# Patient Record
Sex: Female | Born: 1955 | Race: White | Hispanic: No | Marital: Married | Smoking: Never smoker
Health system: Southern US, Community
[De-identification: ages and names within clinical notes are randomized; demographics above are authoritative.]

## PROBLEM LIST (undated history)

## (undated) DIAGNOSIS — I10 Essential (primary) hypertension: Secondary | ICD-10-CM

## (undated) DIAGNOSIS — T7840XA Allergy, unspecified, initial encounter: Secondary | ICD-10-CM

## (undated) HISTORY — PX: ABDOMINAL HYSTERECTOMY: SHX81

## (undated) HISTORY — PX: BREAST SURGERY: SHX581

---

## 2006-04-27 ENCOUNTER — Ambulatory Visit: Payer: Self-pay | Admitting: Gastroenterology

## 2006-04-30 ENCOUNTER — Ambulatory Visit: Payer: Self-pay | Admitting: Gastroenterology

## 2006-06-14 ENCOUNTER — Encounter: Payer: Self-pay | Admitting: Family Medicine

## 2006-07-02 ENCOUNTER — Encounter: Payer: Self-pay | Admitting: Family Medicine

## 2006-07-20 ENCOUNTER — Ambulatory Visit: Payer: Self-pay | Admitting: Family Medicine

## 2006-07-25 ENCOUNTER — Ambulatory Visit: Payer: Self-pay | Admitting: Family Medicine

## 2006-08-02 ENCOUNTER — Encounter: Payer: Self-pay | Admitting: Family Medicine

## 2006-10-17 ENCOUNTER — Ambulatory Visit: Payer: Self-pay | Admitting: Anesthesiology

## 2006-10-25 ENCOUNTER — Ambulatory Visit: Payer: Self-pay | Admitting: Anesthesiology

## 2006-11-22 ENCOUNTER — Ambulatory Visit: Payer: Self-pay | Admitting: Anesthesiology

## 2007-02-18 ENCOUNTER — Ambulatory Visit: Payer: Self-pay | Admitting: Anesthesiology

## 2007-04-16 ENCOUNTER — Ambulatory Visit: Payer: Self-pay | Admitting: Anesthesiology

## 2007-07-23 ENCOUNTER — Ambulatory Visit: Payer: Self-pay | Admitting: Anesthesiology

## 2008-03-10 ENCOUNTER — Emergency Department: Payer: Self-pay | Admitting: Emergency Medicine

## 2011-11-30 ENCOUNTER — Ambulatory Visit: Payer: Self-pay | Admitting: Family Medicine

## 2012-12-03 ENCOUNTER — Ambulatory Visit: Payer: Self-pay | Admitting: Family Medicine

## 2013-12-17 ENCOUNTER — Ambulatory Visit: Payer: Self-pay | Admitting: Family Medicine

## 2013-12-22 ENCOUNTER — Ambulatory Visit: Payer: Self-pay | Admitting: Family Medicine

## 2014-01-12 ENCOUNTER — Ambulatory Visit: Payer: Self-pay | Admitting: Family Medicine

## 2014-01-12 HISTORY — PX: BREAST BIOPSY: SHX20

## 2014-01-15 LAB — PATHOLOGY REPORT

## 2015-01-27 ENCOUNTER — Ambulatory Visit
Admit: 2015-01-27 | Disposition: A | Discharge status: Home / Self Care | Payer: Self-pay | Attending: Family Medicine | Admitting: Family Medicine

## 2016-10-23 ENCOUNTER — Encounter: Payer: Self-pay | Admitting: *Deleted

## 2016-10-24 ENCOUNTER — Ambulatory Visit: Payer: BLUE CROSS/BLUE SHIELD | Admitting: Anesthesiology

## 2016-10-24 ENCOUNTER — Ambulatory Visit
Admission: RE | Admit: 2016-10-24 | Discharge: 2016-10-24 | Disposition: A | Discharge status: Home / Self Care | Payer: BLUE CROSS/BLUE SHIELD | Source: Ambulatory Visit | Attending: Gastroenterology | Admitting: Gastroenterology

## 2016-10-24 ENCOUNTER — Encounter
Admission: RE | Disposition: A | Discharge status: Home / Self Care | Payer: Self-pay | Source: Ambulatory Visit | Attending: Gastroenterology

## 2016-10-24 DIAGNOSIS — D123 Benign neoplasm of transverse colon: Secondary | ICD-10-CM | POA: Diagnosis not present

## 2016-10-24 DIAGNOSIS — I10 Essential (primary) hypertension: Secondary | ICD-10-CM | POA: Diagnosis not present

## 2016-10-24 DIAGNOSIS — Z8371 Family history of colonic polyps: Secondary | ICD-10-CM | POA: Insufficient documentation

## 2016-10-24 DIAGNOSIS — Z1211 Encounter for screening for malignant neoplasm of colon: Secondary | ICD-10-CM | POA: Diagnosis present

## 2016-10-24 DIAGNOSIS — K5730 Diverticulosis of large intestine without perforation or abscess without bleeding: Secondary | ICD-10-CM | POA: Insufficient documentation

## 2016-10-24 DIAGNOSIS — Z7982 Long term (current) use of aspirin: Secondary | ICD-10-CM | POA: Diagnosis not present

## 2016-10-24 DIAGNOSIS — Z79899 Other long term (current) drug therapy: Secondary | ICD-10-CM | POA: Diagnosis not present

## 2016-10-24 HISTORY — DX: Allergy, unspecified, initial encounter: T78.40XA

## 2016-10-24 HISTORY — PX: COLONOSCOPY: SHX5424

## 2016-10-24 HISTORY — DX: Essential (primary) hypertension: I10

## 2016-10-24 SURGERY — COLONOSCOPY
Anesthesia: General

## 2016-10-24 MED ORDER — MIDAZOLAM HCL 2 MG/2ML IJ SOLN
INTRAMUSCULAR | Status: AC
  Filled 2016-10-24: qty 2

## 2016-10-24 MED ORDER — EPHEDRINE SULFATE 50 MG/ML IJ SOLN
INTRAMUSCULAR | Status: DC | PRN
  Administered 2016-10-24 (×2): 15 mg via INTRAVENOUS

## 2016-10-24 MED ORDER — FENTANYL CITRATE (PF) 100 MCG/2ML IJ SOLN
INTRAMUSCULAR | Status: AC
  Filled 2016-10-24: qty 2

## 2016-10-24 MED ORDER — LIDOCAINE HCL (CARDIAC) 20 MG/ML IV SOLN
INTRAVENOUS | Status: DC | PRN
  Administered 2016-10-24: 40 mg via INTRAVENOUS

## 2016-10-24 MED ORDER — SODIUM CHLORIDE 0.9 % IV SOLN
INTRAVENOUS | Status: DC
  Administered 2016-10-24 (×2): via INTRAVENOUS

## 2016-10-24 MED ORDER — MIDAZOLAM HCL 2 MG/2ML IJ SOLN
INTRAMUSCULAR | Status: DC | PRN
  Administered 2016-10-24: 1 mg via INTRAVENOUS

## 2016-10-24 MED ORDER — PROPOFOL 500 MG/50ML IV EMUL
INTRAVENOUS | Status: AC
  Filled 2016-10-24: qty 50

## 2016-10-24 MED ORDER — PROPOFOL 10 MG/ML IV BOLUS
INTRAVENOUS | Status: DC | PRN
  Administered 2016-10-24: 40 mg via INTRAVENOUS
  Administered 2016-10-24: 10 mg via INTRAVENOUS

## 2016-10-24 MED ORDER — EPHEDRINE 5 MG/ML INJ
INTRAVENOUS | Status: AC
  Filled 2016-10-24: qty 10

## 2016-10-24 MED ORDER — FENTANYL CITRATE (PF) 100 MCG/2ML IJ SOLN
INTRAMUSCULAR | Status: DC | PRN
  Administered 2016-10-24: 50 ug via INTRAVENOUS

## 2016-10-24 MED ORDER — SODIUM CHLORIDE 0.9 % IV SOLN: INTRAVENOUS | Status: DC

## 2016-10-24 MED ORDER — PROPOFOL 500 MG/50ML IV EMUL
INTRAVENOUS | Status: DC | PRN
Start: 2016-10-24 — End: 2016-10-24
  Administered 2016-10-24: 150 ug/kg/min via INTRAVENOUS

## 2016-10-24 NOTE — Op Note (Signed)
[facility] Gastroenterology Patient Name: Stacy Strong Procedure Date: 10/24/2016 12:59 PM MRN: [id number] Account #: [id number] Date of Birth: 1956/03/19 Admit Type: Outpatient Age: 61 Room: [facility] ENDO ROOM 3 Gender: Female Note Status: Finalized Procedure:            Colonoscopy Indications:          Colon cancer screening in patient at increased risk:                        Family history of 1st-degree relative with colon polyps Providers:            Lollie Sails, MD Referring MD:         Gayland Curry MD, MD (Referring MD) Medicines:            Monitored Anesthesia Care Complications:        No immediate complications. Procedure:            Pre-Anesthesia Assessment:                       - ASA Grade Assessment: II - A patient with mild                        systemic disease.                       After obtaining informed consent, the colonoscope was                        passed under direct vision. Throughout the procedure,                        the patient's blood pressure, pulse, and oxygen                        saturations were monitored continuously. The                        Colonoscope was introduced through the anus and                        advanced to the the cecum, identified by appendiceal                        orifice and ileocecal valve. The colonoscopy was                        performed without difficulty. The patient tolerated the                        procedure well. The quality of the bowel preparation                        was good. Findings:      A 3 mm polyp was found in the hepatic flexure. The polyp was sessile.       The polyp was removed with a cold snare. Resection and retrieval were       complete.      A few small-mouthed diverticula were found in the sigmoid colon.      The digital rectal exam was normal. Impression:           -  One 3 mm polyp at the hepatic flexure, removed with a   cold snare. Resected and retrieved.                       - Diverticulosis in the sigmoid colon. Recommendation:       - Discharge patient to home.                       - Await pathology results.                       - Telephone GI clinic for pathology results in 1 week. Procedure Code(s):    --- Professional ---                       [phone], Colonoscopy, flexible; with removal of tumor(s),                        polyp(s), or other lesion(s) by snare technique Diagnosis Code(s):    --- Professional ---                       Z83.71, Family history of colonic polyps                       D12.3, Benign neoplasm of transverse colon (hepatic                        flexure or splenic flexure)                       K57.30, Diverticulosis of large intestine without                        perforation or abscess without bleeding CPT copyright 2016 [employer]. All rights reserved. The codes documented in this report are preliminary and upon coder review may  be revised to meet current compliance requirements. Lollie Sails, MD 10/24/2016 1:29:10 PM This report has been signed electronically. Number of Addenda: 0 Note Initiated On: 10/24/2016 12:59 PM Scope Withdrawal Time: 0 hours 7 minutes 17 seconds  Total Procedure Duration: 0 hours 15 minutes 1 second       [facility]

## 2016-10-24 NOTE — Anesthesia Post-op Follow-up Note (Cosign Needed)
Anesthesia QCDR form completed.        

## 2016-10-24 NOTE — Anesthesia Procedure Notes (Signed)
Date/Time: 10/24/2016 1:10 PM Performed by: Allean Found Pre-anesthesia Checklist: Patient identified, Emergency Drugs available, Suction available, Patient being monitored and Timeout performed Patient Re-evaluated:Patient Re-evaluated prior to inductionOxygen Delivery Method: Nasal cannula Intubation Type: IV induction

## 2016-10-24 NOTE — Anesthesia Preprocedure Evaluation (Signed)
Anesthesia Evaluation  Patient identified by MRN, date of birth, ID band Patient awake    Reviewed: Allergy & Precautions, H&P , NPO status , Patient's Chart, lab work & pertinent test results, reviewed documented beta blocker date and time   History of Anesthesia Complications Negative for: history of anesthetic complications  Airway Mallampati: II  TM Distance: >3 FB Neck ROM: full    Dental  (+) Missing, Poor Dentition   Pulmonary Recent URI , Resolved,           Cardiovascular Exercise Tolerance: Good hypertension, (-) angina(-) CAD, (-) Past MI, (-) Cardiac Stents and (-) CABG (-) dysrhythmias (-) Valvular Problems/Murmurs     Neuro/Psych negative neurological ROS  negative psych ROS   GI/Hepatic negative GI ROS, Neg liver ROS,   Endo/Other  negative endocrine ROS  Renal/GU negative Renal ROS  negative genitourinary   Musculoskeletal   Abdominal   Peds  Hematology negative hematology ROS (+)   Anesthesia Other Findings Past Medical History: No date: Allergic state No date: Hypertension   Reproductive/Obstetrics negative OB ROS                             Anesthesia Physical Anesthesia Plan  ASA: II  Anesthesia Plan: General   Post-op Pain Management:    Induction:   Airway Management Planned:   Additional Equipment:   Intra-op Plan:   Post-operative Plan:   Informed Consent: I have reviewed the patients History and Physical, chart, labs and discussed the procedure including the risks, benefits and alternatives for the proposed anesthesia with the patient or authorized representative who has indicated his/her understanding and acceptance.   Dental Advisory Given  Plan Discussed with: Anesthesiologist, CRNA and Surgeon  Anesthesia Plan Comments:         Anesthesia Quick Evaluation

## 2016-10-24 NOTE — H&P (Signed)
Outpatient short stay form Pre-procedure 10/24/2016 12:59 PM Stacy Sails MD  Primary Physician: Dr. Gayland Curry  Reason for visit:  Screening colonoscopy  History of present illness:  Patient is a 61 year old female presenting today as above. She tolerated her prep well. She takes no 1 thinners. She does take a 81 mg aspirin daily that has been held for several days. She also relates that he she has some occasional left flank discomfort. He sees no blood in the stool.    Current Facility-Administered Medications:  .  0.9 %  sodium chloride infusion, , Intravenous, Continuous, Stacy Sails, MD, Last Rate: 20 mL/hr at 10/24/16 1153 .  0.9 %  sodium chloride infusion, , Intravenous, Continuous, Stacy Sails, MD  Prescriptions Prior to Admission  Medication Sig Dispense Refill Last Dose  . amitriptyline (ELAVIL) 50 MG tablet Take 50 mg by mouth at bedtime.   10/23/2016 at Unknown time  . aspirin EC 81 MG tablet Take 81 mg by mouth daily.   10/23/2016 at Unknown time  . conjugated estrogens (PREMARIN) vaginal cream Place 1 Applicatorful vaginally 2 (two) times a week.   Past Month at Unknown time  . docusate sodium (COLACE) 250 MG capsule Take 250 mg by mouth daily.   Past Week at Unknown time  . enalapril (VASOTEC) 5 MG tablet Take 5 mg by mouth daily.   10/24/2016 at Unknown time  . fluticasone (FLONASE) 50 MCG/ACT nasal spray Place 1 spray into both nostrils daily.   10/23/2016 at Unknown time  . montelukast (SINGULAIR) 10 MG tablet Take 10 mg by mouth at bedtime.   10/24/2016 at Unknown time  . mupirocin cream (BACTROBAN) 2 % Apply 1 application topically 2 (two) times daily.   Past Week at Unknown time  . triamterene-hydrochlorothiazide (DYAZIDE) 37.5-25 MG capsule Take 1 capsule by mouth daily.   10/24/2016 at Unknown time     No Known Allergies   Past Medical History:  Diagnosis Date  . Allergic state   . Hypertension     Review of systems:      Physical  Exam    Heart and lungs: Regular rate and rhythm without rub or gallop, lungs are bilaterally clear.    HEENT: Normocephalic atraumatic eyes are anicteric    Other:     Pertinant exam for procedure: Soft nontender nondistended bowel sounds positive normoactive.    Planned proceedures: Colonoscopy and indicated procedures. I have discussed the risks benefits and complications of procedures to include not limited to bleeding, infection, perforation and the risk of sedation and the patient wishes to proceed.    Stacy Sails, MD Gastroenterology 10/24/2016  12:59 PM

## 2016-10-24 NOTE — Transfer of Care (Signed)
Immediate Anesthesia Transfer of Care Note  Patient: Stacy Strong  Procedure(s) Performed: Procedure(s): COLONOSCOPY (N/A)  Patient Location: PACU  Anesthesia Type:General  Level of Consciousness: awake  Airway & Oxygen Therapy: Patient Spontanous Breathing and Patient connected to nasal cannula oxygen  Post-op Assessment: Report given to RN and Post -op Vital signs reviewed and stable  Post vital signs: Reviewed and stable  Last Vitals:  Vitals:   10/24/16 1140  BP: (!) 107/57  Pulse: 66  Resp: 18  Temp: (!) 35.9 C    Last Pain:  Vitals:   10/24/16 1140  TempSrc: Tympanic         Complications: No apparent anesthesia complications

## 2016-10-25 LAB — SURGICAL PATHOLOGY

## 2016-10-25 NOTE — Anesthesia Postprocedure Evaluation (Signed)
Anesthesia Post Note  Patient: Stacy Strong  Procedure(s) Performed: Procedure(s) (LRB): COLONOSCOPY (N/A)  Patient location during evaluation: Endoscopy Anesthesia Type: General Level of consciousness: awake and alert Pain management: pain level controlled Vital Signs Assessment: post-procedure vital signs reviewed and stable Respiratory status: spontaneous breathing, nonlabored ventilation, respiratory function stable and patient connected to nasal cannula oxygen Cardiovascular status: blood pressure returned to baseline and stable Postop Assessment: no signs of nausea or vomiting Anesthetic complications: no     Last Vitals:  Vitals:   10/24/16 1345 10/24/16 1405  BP: (!) 101/57 (!) 101/59  Pulse:    Resp:    Temp:      Last Pain:  Vitals:   10/25/16 0741  TempSrc:   PainSc: 0-No pain                 Martha Clan

## 2016-10-26 ENCOUNTER — Encounter: Payer: Self-pay | Admitting: Gastroenterology

## 2016-11-17 ENCOUNTER — Other Ambulatory Visit: Payer: Self-pay | Admitting: Family Medicine

## 2016-11-17 DIAGNOSIS — Z1231 Encounter for screening mammogram for malignant neoplasm of breast: Secondary | ICD-10-CM

## 2016-12-13 ENCOUNTER — Ambulatory Visit
Admission: RE | Admit: 2016-12-13 | Discharge: 2016-12-13 | Disposition: A | Discharge status: Home / Self Care | Payer: BLUE CROSS/BLUE SHIELD | Source: Ambulatory Visit | Attending: Family Medicine | Admitting: Family Medicine

## 2016-12-13 DIAGNOSIS — Z1231 Encounter for screening mammogram for malignant neoplasm of breast: Secondary | ICD-10-CM

## 2017-11-09 ENCOUNTER — Other Ambulatory Visit: Payer: Self-pay | Admitting: Family Medicine

## 2017-11-09 DIAGNOSIS — Z1231 Encounter for screening mammogram for malignant neoplasm of breast: Secondary | ICD-10-CM

## 2017-12-19 ENCOUNTER — Ambulatory Visit: Payer: BLUE CROSS/BLUE SHIELD | Attending: Family Medicine

## 2018-01-02 ENCOUNTER — Ambulatory Visit
Admission: RE | Admit: 2018-01-02 | Discharge: 2018-01-02 | Disposition: A | Discharge status: Home / Self Care | Payer: BLUE CROSS/BLUE SHIELD | Source: Ambulatory Visit | Attending: Family Medicine | Admitting: Family Medicine

## 2018-01-02 DIAGNOSIS — Z1231 Encounter for screening mammogram for malignant neoplasm of breast: Secondary | ICD-10-CM | POA: Diagnosis present

## 2019-03-03 ENCOUNTER — Other Ambulatory Visit: Payer: Self-pay | Admitting: Family Medicine

## 2019-03-03 DIAGNOSIS — Z1231 Encounter for screening mammogram for malignant neoplasm of breast: Secondary | ICD-10-CM

## 2019-04-16 ENCOUNTER — Other Ambulatory Visit: Payer: Self-pay

## 2019-04-16 ENCOUNTER — Ambulatory Visit
Admission: RE | Admit: 2019-04-16 | Discharge: 2019-04-16 | Disposition: A | Discharge status: Home / Self Care | Payer: BC Managed Care – PPO | Source: Ambulatory Visit | Attending: Family Medicine | Admitting: Family Medicine

## 2019-04-16 DIAGNOSIS — Z1231 Encounter for screening mammogram for malignant neoplasm of breast: Secondary | ICD-10-CM | POA: Diagnosis present

## 2020-03-02 ENCOUNTER — Other Ambulatory Visit: Payer: Self-pay | Admitting: Family Medicine

## 2020-03-02 DIAGNOSIS — Z1231 Encounter for screening mammogram for malignant neoplasm of breast: Secondary | ICD-10-CM

## 2020-04-16 ENCOUNTER — Ambulatory Visit
Admission: RE | Admit: 2020-04-16 | Discharge: 2020-04-16 | Disposition: A | Discharge status: Home / Self Care | Payer: BC Managed Care – PPO | Source: Ambulatory Visit | Attending: Family Medicine | Admitting: Family Medicine

## 2020-04-16 DIAGNOSIS — Z1231 Encounter for screening mammogram for malignant neoplasm of breast: Secondary | ICD-10-CM | POA: Insufficient documentation

## 2020-11-30 ENCOUNTER — Inpatient Hospital Stay
Admission: EM | Admit: 2020-11-30 | Discharge: 2020-12-03 | DRG: 151 | Disposition: A | Discharge status: Home / Self Care | Payer: BC Managed Care – PPO | Attending: Internal Medicine | Admitting: Internal Medicine

## 2020-11-30 ENCOUNTER — Other Ambulatory Visit: Payer: Self-pay

## 2020-11-30 DIAGNOSIS — J302 Other seasonal allergic rhinitis: Secondary | ICD-10-CM | POA: Diagnosis present

## 2020-11-30 DIAGNOSIS — J3489 Other specified disorders of nose and nasal sinuses: Secondary | ICD-10-CM

## 2020-11-30 DIAGNOSIS — K219 Gastro-esophageal reflux disease without esophagitis: Secondary | ICD-10-CM | POA: Diagnosis present

## 2020-11-30 DIAGNOSIS — I10 Essential (primary) hypertension: Secondary | ICD-10-CM | POA: Diagnosis not present

## 2020-11-30 DIAGNOSIS — Z79899 Other long term (current) drug therapy: Secondary | ICD-10-CM

## 2020-11-30 DIAGNOSIS — R040 Epistaxis: Secondary | ICD-10-CM | POA: Diagnosis not present

## 2020-11-30 DIAGNOSIS — G4733 Obstructive sleep apnea (adult) (pediatric): Secondary | ICD-10-CM | POA: Diagnosis present

## 2020-11-30 DIAGNOSIS — Z20822 Contact with and (suspected) exposure to covid-19: Secondary | ICD-10-CM | POA: Diagnosis present

## 2020-11-30 DIAGNOSIS — R1310 Dysphagia, unspecified: Secondary | ICD-10-CM | POA: Diagnosis present

## 2020-11-30 DIAGNOSIS — R519 Headache, unspecified: Secondary | ICD-10-CM | POA: Diagnosis present

## 2020-11-30 DIAGNOSIS — Z79818 Long term (current) use of other agents affecting estrogen receptors and estrogen levels: Secondary | ICD-10-CM

## 2020-11-30 DIAGNOSIS — Z6836 Body mass index (BMI) 36.0-36.9, adult: Secondary | ICD-10-CM

## 2020-11-30 LAB — BASIC METABOLIC PANEL
Anion gap: 8 (ref 5–15)
BUN: 18 mg/dL (ref 8–23)
CO2: 26 mmol/L (ref 22–32)
Calcium: 9 mg/dL (ref 8.9–10.3)
Chloride: 103 mmol/L (ref 98–111)
Creatinine, Ser: 0.87 mg/dL (ref 0.44–1.00)
GFR, Estimated: >60 mL/min (ref >60)
Glucose, Bld: 144 mg/dL — ABNORMAL HIGH (ref 70–99)
Potassium: 3.9 mmol/L (ref 3.5–5.1)
Sodium: 137 mmol/L (ref 135–145)

## 2020-11-30 LAB — CBC WITH DIFFERENTIAL/PLATELET
Abs Immature Granulocytes: 0.04 10*3/uL (ref 0.00–0.07)
Basophils Absolute: 0.1 10*3/uL (ref 0.0–0.1)
Basophils Relative: 1 %
Eosinophils Absolute: 0.1 10*3/uL (ref 0.0–0.5)
Eosinophils Relative: 1 %
HCT: 42.5 % (ref 36.0–46.0)
Hemoglobin: 14.3 g/dL (ref 12.0–15.0)
Immature Granulocytes: 0 %
Lymphocytes Relative: 17 %
Lymphs Abs: 2 10*3/uL (ref 0.7–4.0)
MCH: 29.1 pg (ref 26.0–34.0)
MCHC: 33.6 g/dL (ref 30.0–36.0)
MCV: 86.4 fL (ref 80.0–100.0)
Monocytes Absolute: 0.7 10*3/uL (ref 0.1–1.0)
Monocytes Relative: 6 %
Neutro Abs: 8.4 10*3/uL — ABNORMAL HIGH (ref 1.7–7.7)
Neutrophils Relative %: 75 %
Platelets: 273 10*3/uL (ref 150–400)
RBC: 4.92 MIL/uL (ref 3.87–5.11)
RDW: 13.1 % (ref 11.5–15.5)
WBC: 11.3 10*3/uL — ABNORMAL HIGH (ref 4.0–10.5)
nRBC: 0 % (ref 0.0–0.2)

## 2020-11-30 LAB — PROTIME-INR
INR: 1.1 (ref 0.8–1.2)
Prothrombin Time: 13.4 seconds (ref 11.4–15.2)

## 2020-11-30 MED ORDER — FENTANYL CITRATE (PF) 100 MCG/2ML IJ SOLN
50.0000 ug | INTRAMUSCULAR | Status: AC | PRN
  Administered 2020-12-01 (×4): 50 ug via INTRAVENOUS
  Filled 2020-11-30 (×4): qty 2

## 2020-11-30 MED ORDER — ONDANSETRON HCL 4 MG/2ML IJ SOLN: 4.0000 mg | Freq: Four times a day (QID) | INTRAMUSCULAR | Status: DC | PRN

## 2020-11-30 MED ORDER — METOPROLOL TARTRATE 5 MG/5ML IV SOLN: 5.0000 mg | INTRAVENOUS | Status: DC | PRN

## 2020-11-30 MED ORDER — ACETAMINOPHEN 325 MG PO TABS
325.0000 mg | ORAL_TABLET | Freq: Four times a day (QID) | ORAL | Status: DC | PRN
  Administered 2020-12-01 – 2020-12-03 (×4): 325 mg via ORAL
  Filled 2020-11-30 (×5): qty 1

## 2020-11-30 MED ORDER — ALBUTEROL SULFATE HFA 108 (90 BASE) MCG/ACT IN AERS
2.0000 | INHALATION_SPRAY | RESPIRATORY_TRACT | Status: DC | PRN
  Filled 2020-11-30: qty 6.7

## 2020-11-30 MED ORDER — MORPHINE SULFATE (PF) 2 MG/ML IV SOLN: 0.5000 mg | Freq: Once | INTRAVENOUS | Status: DC

## 2020-11-30 MED ORDER — FENTANYL CITRATE (PF) 100 MCG/2ML IJ SOLN
50.0000 ug | Freq: Once | INTRAMUSCULAR | Status: AC
  Administered 2020-11-30: 50 ug via INTRAVENOUS
  Filled 2020-11-30: qty 2

## 2020-11-30 MED ORDER — FENTANYL CITRATE (PF) 100 MCG/2ML IJ SOLN
INTRAMUSCULAR | Status: AC
  Administered 2020-11-30: 50 ug via INTRAVENOUS
  Filled 2020-11-30: qty 2

## 2020-11-30 MED ORDER — ONDANSETRON HCL 4 MG PO TABS
4.0000 mg | ORAL_TABLET | Freq: Four times a day (QID) | ORAL | Status: DC | PRN
Start: 2020-11-30 — End: 2020-12-03

## 2020-11-30 MED ORDER — FENTANYL CITRATE (PF) 100 MCG/2ML IJ SOLN: 50.0000 ug | Freq: Once | INTRAMUSCULAR | Status: AC

## 2020-11-30 MED ORDER — ENALAPRIL MALEATE 10 MG PO TABS: 5.0000 mg | ORAL_TABLET | Freq: Every day | ORAL | Status: DC

## 2020-11-30 MED ORDER — ONDANSETRON HCL 4 MG/2ML IJ SOLN
4.0000 mg | Freq: Once | INTRAMUSCULAR | Status: AC
  Administered 2020-11-30: 4 mg via INTRAVENOUS
  Filled 2020-11-30: qty 2

## 2020-11-30 MED ORDER — DM-GUAIFENESIN ER 30-600 MG PO TB12
1.0000 | ORAL_TABLET | Freq: Two times a day (BID) | ORAL | Status: DC | PRN
  Administered 2020-12-01: 1 via ORAL
  Filled 2020-11-30 (×2): qty 1

## 2020-11-30 MED ORDER — TRIAMTERENE-HCTZ 37.5-25 MG PO CAPS
1.0000 | ORAL_CAPSULE | Freq: Every day | ORAL | Status: DC
  Filled 2020-11-30 (×2): qty 1

## 2020-11-30 MED ORDER — HYDRALAZINE HCL 20 MG/ML IJ SOLN: 10.0000 mg | Freq: Four times a day (QID) | INTRAMUSCULAR | Status: DC | PRN

## 2020-11-30 MED ORDER — ACETAMINOPHEN 650 MG RE SUPP: 325.0000 mg | Freq: Four times a day (QID) | RECTAL | Status: DC | PRN

## 2020-11-30 MED ORDER — LABETALOL HCL 5 MG/ML IV SOLN
5.0000 mg | Freq: Once | INTRAVENOUS | Status: AC
  Administered 2020-11-30: 5 mg via INTRAVENOUS
  Filled 2020-11-30: qty 4

## 2020-11-30 MED ORDER — ONDANSETRON HCL 4 MG/2ML IJ SOLN
INTRAMUSCULAR | Status: AC
  Administered 2020-11-30: 4 mg via INTRAVENOUS
  Filled 2020-11-30: qty 2

## 2020-11-30 MED ORDER — ONDANSETRON HCL 4 MG/2ML IJ SOLN: 4.0000 mg | Freq: Once | INTRAMUSCULAR | Status: AC

## 2020-11-30 MED ORDER — FENTANYL CITRATE (PF) 100 MCG/2ML IJ SOLN
50.0000 ug | Freq: Once | INTRAMUSCULAR | Status: AC
  Administered 2020-11-30: 50 ug via INTRAVENOUS

## 2020-11-30 NOTE — ED Notes (Signed)
Pain meds given.   md at bedside.

## 2020-11-30 NOTE — H&P (Addendum)
History and Physical   Stacy Strong [id number] DOB: Feb 04, 1956 DOA: 11/30/2020  PCP: Gayland Curry, MD  Patient coming from: home via EMS  I have personally briefly reviewed patient's old medical records in [place].  Chief Concern: Epistasis  HPI: Stacy Strong is a 65 y.o. female with medical history significant for hypertension, presents to the ED for chief concerns of nosebleed.  She denies history of bleeding disorder.  She denies trauma.  She reports this is never happened before.  She developed bleeding at home however was able to control it minimally and went to golf.  She was playing golf and mated to the first hole when she developed bleeding of her nose.  She denies fever, chest pain, shortness of breath, dysuria, hematuria.  She endorses pain with swallowing at this time.  Past medical history of mucosal bleeding: No  Social history: Spouse, denies alcohol of smoking, EtOH, recreational drug use  Vaccination: She is vaccinated for COVID-19  ROS: Constitutional: no weight change, no fever ENT/Mouth: no sore throat, no rhinorrhea Eyes: no eye pain, no vision changes Cardiovascular: no chest pain, no dyspnea,  no edema, no palpitations Respiratory: no cough, no sputum, no wheezing Gastrointestinal: no nausea, no vomiting, no diarrhea, no constipation Genitourinary: no urinary incontinence, no dysuria, no hematuria Musculoskeletal: no arthralgias, no myalgias Skin: no skin lesions, no pruritus, Neuro: + weakness, no loss of consciousness, no syncope Psych: no anxiety, no depression, + decrease appetite Heme/Lymph: no bruising, no bleeding  ED Course: Discussed with ED provider, patient requiring hospitalization due to persistent epistasis requiring posterior double-balloon packing applied bilaterally by ED provider.  Vitals in the ED was afebrile with temperature of 98, heart heart rate 79, respiration rate 16, blood pressure 133/79, satting at 96  percent on room air.  PT was 13.4, INR 1.1, patient is not taking any anticoagulation.  She takes a daily low-dose aspirin.  WBC elevated at 11.3, hemoglobin 14.3, platelets 273.  Assessment/Plan  Active Problems:   Bleeding from the nose   Essential hypertension   Epistasis - etiology unclear -Status post bilateral posterior double balloon packing for tamponade -ENT has been consulted, Dr. Pryor Ochoa and he is aware of patient -If patient remains stable on telemetry and does not feel dizziness or presyncopal events, patient can likely be discharged tomorrow 12/01/2020 and follow-up with ENT outpatient for removal of the bilateral double-balloon nasal packing -Patient admitted for observation due to risk of syncope and persistent epistasis requiring bilateral posterior double-balloon tamponading -Pain control fentanyl 50 mcg every 2 hours as needed for severe moderate pain, 1 day -A.m. team to help arrange for ENT outpatient appointment -At the 10 mL port-there are 10 mL of air bilaterally for tamponade -Note: At the 30 ml port-there are 10 mL of air bilaterally, if needed an additional 20 ml of air can be added via syringe for further tamponade  Hypertension-on home hydrochlorothiazide 12.5 mg daily  Seasonal allergies Singulair nightly, Flonase daily,  GERD-famotidine 10 mg twice daily  Chart reviewed.   DVT prophylaxis: TED hose Code Status: Full code Diet: N.p.o. Family Communication: Updated spouse at bedside Disposition Plan: Pending clinical course Consults called: ENT Admission status: Observation with telemetry  Past Medical History:  Diagnosis Date  . Allergic state   . Hypertension    Past Surgical History:  Procedure Laterality Date  . ABDOMINAL HYSTERECTOMY    . BREAST BIOPSY Left 01/12/2014    FOCAL MICROCALCIFICATIONS IN FIBROCYSTIC CHANGES.   Marland Kitchen BREAST  SURGERY Left    breast bx.with chip placement  . COLONOSCOPY N/A 10/24/2016   Procedure: COLONOSCOPY;   Surgeon: Lollie Sails, MD;  Location: [facility] ENDOSCOPY;  Service: Endoscopy;  Laterality: N/A;   Social History:  reports that she has never smoked. She has never used smokeless tobacco. She reports that she does not drink alcohol and does not use drugs.  No Known Allergies Family History  Problem Relation Age of Onset  . Breast cancer Maternal Aunt    Family history: Family history reviewed and not pertinent  Prior to Admission medications   Medication Sig Start Date End Date Taking? Authorizing Provider  amitriptyline (ELAVIL) 50 MG tablet Take 50 mg by mouth at bedtime.    [provider]  aspirin EC 81 MG tablet Take 81 mg by mouth daily.    [provider]  conjugated estrogens (PREMARIN) vaginal cream Place 1 Applicatorful vaginally 2 (two) times a week.    [provider]  docusate sodium (COLACE) 250 MG capsule Take 250 mg by mouth daily.    [provider]  enalapril (VASOTEC) 5 MG tablet Take 5 mg by mouth daily.    [provider]  fluticasone (FLONASE) 50 MCG/ACT nasal spray Place 1 spray into both nostrils daily.    [provider]  montelukast (SINGULAIR) 10 MG tablet Take 10 mg by mouth at bedtime.    [provider]  mupirocin cream (BACTROBAN) 2 % Apply 1 application topically 2 (two) times daily.    [provider]  triamterene-hydrochlorothiazide (DYAZIDE) 37.5-25 MG capsule Take 1 capsule by mouth daily.    [provider]   Physical Exam: Vitals:   11/30/20 1730 11/30/20 1745 11/30/20 1800 11/30/20 1809  BP: 133/79 137/67 (!) 149/70   Pulse: 85 80 76   Resp:    20  Temp:      TempSrc:      SpO2: 100% 95% 95%   Weight:      Height:       Constitutional: appears age-appropriate, NAD, calm, comfortable Eyes: PERRL, lids and conjunctivae normal ENMT: Mucous membranes are moist. Bilateral double balloon tamponade and bilateral nares. Posterior pharynx-presence of blood, postnasal  from naris. Age-appropriate dentition. Hearing appropriate Neck: normal, supple, no masses, no thyromegaly Respiratory: clear to auscultation bilaterally, no wheezing, no crackles. Normal respiratory effort. No accessory muscle use.  Cardiovascular: Regular rate and rhythm, no murmurs / rubs / gallops. No extremity edema. 2+ pedal pulses. No carotid bruits.  Abdomen: Obese abdomen, no tenderness, no masses palpated, no hepatosplenomegaly. Bowel sounds positive.  Musculoskeletal: no clubbing / cyanosis. No joint deformity upper and lower extremities. Good ROM, no contractures, no atrophy. Normal muscle tone.  Skin: no rashes, lesions, ulcers. No induration Neurologic: Sensation intact. Strength 5/5 in all 4.  Psychiatric: Normal judgment and insight. Alert and oriented x 3. Normal mood.   EKG: independently reviewed, showing normal sinus rhythm, rate of 71, QTc 425  Chest x-ray on Admission: Not indicated  Labs on Admission: I have personally reviewed following labs  CBC: Recent Labs  Lab 11/30/20 1520  WBC 11.3*  NEUTROABS 8.4*  HGB 14.3  HCT 42.5  MCV 86.4  PLT 273   Coagulation Profile: Recent Labs  Lab 11/30/20 1520  INR 1.1   Arma Reining N Ger Nicks D.O. [facility]  If 7PM-7AM, please contact overnight-coverage provider If 7AM-7PM, please contact day coverage provider [email or web address]  11/30/2020, 6:12 PM

## 2020-11-30 NOTE — ED Notes (Signed)
Pt calmer now.  Minimal bleeding from nares.  Family with pt.

## 2020-11-30 NOTE — ED Notes (Signed)
Report messaged to debra rn floor nurse

## 2020-11-30 NOTE — ED Notes (Addendum)
RHINO REMOVED FROM BOTH NARES BY MD.  Replaced with nasal catheter by dr Tamala Julian  Pt crying

## 2020-11-30 NOTE — ED Provider Notes (Signed)
[facility] Emergency Department Provider Note  ____________________________________________   Event Date/Time   First MD Initiated Contact with Patient 11/30/20 1448     (approximate)  I have reviewed the triage vital signs and the nursing notes.   HISTORY  Chief Complaint Epistaxis   HPI Stacy Strong is a 65 y.o. female with a past medical history of OSA, on CPAP at night and HTN who [place] of nosebleed.  Patient states she has had on and off nosebleeds over the last 4 days and had one earlier this morning that improved but then when she went out golfing earlier today started bleeding again from both nostrils.  She states a little bit of headache and has been spitting up blood since her second nosebleed started.  She states she got some Afrin into both nostrils and has a clamp in place over the last 45 minutes.  She endorses a little bit of a headache but no chest pain, cough, shortness of breath, diarrhea, dysuria, Donnell pain, back pain, rash or extremity pain.  Denies any earache, sore throat, vision changes or any other acute sick symptoms.  No recent trauma.  She states all of these episodes began spontaneously.  She does take ASA daily.  She did take blood pressure medicine earlier today as well.  Denies any others with the use or known bleeding disorder.  No prior similar episodes.  No clear alleviating aggravating or precipitating events.         Past Medical History:  Diagnosis Date  . Allergic state   . Hypertension     Patient Active Problem List   Diagnosis Date Noted  . Bleeding from the nose 11/30/2020  . Essential hypertension 11/30/2020    Past Surgical History:  Procedure Laterality Date  . ABDOMINAL HYSTERECTOMY    . BREAST BIOPSY Left 01/12/2014    FOCAL MICROCALCIFICATIONS IN FIBROCYSTIC CHANGES.   Marland Kitchen BREAST SURGERY Left    breast bx.with chip placement  . COLONOSCOPY N/A 10/24/2016    Procedure: COLONOSCOPY;  Surgeon: Lollie Sails, MD;  Location: [facility] ENDOSCOPY;  Service: Endoscopy;  Laterality: N/A;    Prior to Admission medications   Medication Sig Start Date End Date Taking? Authorizing Provider  amitriptyline (ELAVIL) 50 MG tablet Take 50 mg by mouth at bedtime.    [provider]  aspirin EC 81 MG tablet Take 81 mg by mouth daily.    [provider]  conjugated estrogens (PREMARIN) vaginal cream Place 1 Applicatorful vaginally 2 (two) times a week.    [provider]  docusate sodium (COLACE) 250 MG capsule Take 250 mg by mouth daily.    [provider]  enalapril (VASOTEC) 5 MG tablet Take 5 mg by mouth daily.    [provider]  fluticasone (FLONASE) 50 MCG/ACT nasal spray Place 1 spray into both nostrils daily.    [provider]  montelukast (SINGULAIR) 10 MG tablet Take 10 mg by mouth at bedtime.    [provider]  mupirocin cream (BACTROBAN) 2 % Apply 1 application topically 2 (two) times daily.    [provider]  triamterene-hydrochlorothiazide (DYAZIDE) 37.5-25 MG capsule Take 1 capsule by mouth daily.    [provider]    Allergies Patient has no known allergies.  Family History  Problem Relation Age of Onset  . Breast cancer Maternal Aunt     Social History Social History   Tobacco Use  .  Smoking status: Never Smoker  . Smokeless tobacco: Never Used  Substance Use Topics  . Alcohol use: No  . Drug use: No    Review of Systems  Review of Systems  Constitutional: Negative for chills and fever.  HENT: Positive for nosebleeds. Negative for sore throat.   Eyes: Negative for pain.  Respiratory: Negative for cough and stridor.   Cardiovascular: Negative for chest pain.  Gastrointestinal: Negative for vomiting.  Skin: Negative for rash.  Neurological: Negative for seizures, loss of consciousness and headaches.  Psychiatric/Behavioral: Negative for  suicidal ideas.  All other systems reviewed and are negative.     ____________________________________________   PHYSICAL EXAM:  VITAL SIGNS: ED Triage Vitals  Enc Vitals Group     BP      Pulse      Resp      Temp      Temp src      SpO2      Weight      Height      Head Circumference      Peak Flow      Pain Score      Pain Loc      Pain Edu?      Excl. in [place]?    Vitals:   11/30/20 1815 11/30/20 1830  BP: (!) 152/108 (!) 157/67  Pulse: 68 80  Resp:    Temp:    SpO2: 94% 98%   Physical Exam Vitals and nursing note reviewed.  Constitutional:      General: She is not in acute distress.    Appearance: She is well-developed and well-nourished.  HENT:     Head: Normocephalic and atraumatic.     Right Ear: External ear normal.     Left Ear: External ear normal.  Eyes:     Conjunctiva/sclera: Conjunctivae normal.  Cardiovascular:     Rate and Rhythm: Normal rate and regular rhythm.     Heart sounds: No murmur heard.   Pulmonary:     Effort: Pulmonary effort is normal. No respiratory distress.     Breath sounds: Normal breath sounds.  Abdominal:     Palpations: Abdomen is soft.     Tenderness: There is no abdominal tenderness.  Musculoskeletal:        General: No edema.     Cervical back: Neck supple.  Skin:    General: Skin is warm and dry.  Neurological:     Mental Status: She is alert.  Psychiatric:        Mood and Affect: Mood and affect normal.      ____________________________________________   LABS (all labs ordered are listed, but only abnormal results are displayed)  Labs Reviewed  CBC WITH DIFFERENTIAL/PLATELET - Abnormal; Notable for the following components:      Result Value   WBC 11.3 (*)    Neutro Abs 8.4 (*)    All other components within normal limits  BASIC METABOLIC PANEL - Abnormal; Notable for the following components:   Glucose, Bld 144 (*)    All other components within normal limits  SARS CORONAVIRUS 2 (TAT 6-24 HRS)   PROTIME-INR  HIV ANTIBODY (ROUTINE TESTING W REFLEX)  BASIC METABOLIC PANEL  CBC   ____________________________________________  EKG  Sinus rhythm with a ventricular of 71, normal axis, unremarkable intervals and no clear evidence of acute ischemia or other significant underlying arrhythmia. ____________________________________________  RADIOLOGY  ED MD interpretation:   Official radiology report(s): No results found.  ____________________________________________  PROCEDURES  Procedure(s) performed (including Critical Care):  .Epistaxis Management  Date/Time: 11/30/2020 6:45 PM Performed by: Lucrezia Starch, MD Authorized by: Lucrezia Starch, MD   Consent:    Consent obtained:  Verbal   Consent given by:  Patient   Risks, benefits, and alternatives were discussed: yes     Risks discussed:  Bleeding, pain and nasal injury   Alternatives discussed:  No treatment Universal protocol:    Procedure explained and questions answered to patient or proxy's satisfaction: yes     Patient identity confirmed:  Verbally with patient Anesthesia:    Anesthesia method:  None Procedure details:    Treatment site:  R septum, R posterior, L posterior and L septum   Repair method: initially, failed b/l anterior nasal packing, bleeding controleld w/ bilateral posterior backing. Posterio ballons inflated with 10cc each. Anterior ballons inflated with 5cc initially each but then 5cc more given after about 1 hour.   Treatment complexity:  Extensive   Treatment episode: recurring   Post-procedure details:    Assessment:  Bleeding decreased   Procedure completion:  Tolerated with difficulty .1-3 Lead EKG Interpretation Performed by: Lucrezia Starch, MD Authorized by: Lucrezia Starch, MD     Interpretation: normal     ECG rate assessment: normal     Rhythm: sinus rhythm     Ectopy: none     Conduction: normal       ____________________________________________   INITIAL  IMPRESSION / ASSESSMENT AND PLAN / ED COURSE      Patient presents for assessment of epistaxis.  On arrival she is hypertensive with a BP of 150/78 with otherwise stable vital signs on room air.  On exam she has a clamp in place cranial nerves II through XII grossly intact.  Lungs clear bilaterally.  INR unremarkable.  CBC with mild leukocytosis but normal hemoglobin platelets.  BMP unremarkable.  Initially attempted to manage patient's epistaxis with nasal clamp after Afrin was applied prior to arrival.  However patient had some persistent spitting up of blood with this.  Clamp was removed and extensive suctioning performed bilaterally which did not reveal a clear source.  Bilateral anterior packing applied although after approximately 45 minutes patient continued to spit up blood.  Anterior packing was removed and posterior double-balloon packing applied bilaterally.  Posterior balloons inflated with 10 cc of air.  Anterior balloon initially inflated with 5 bilaterally as this is all patient to tolerate although they were subsequently inflated an additional 5 cc bilaterally to 10 after approximately 1 hour.  Patient given below noted analgesia and antiemetics.  Discussed patient with Dr. Pryor Ochoa who is aware patient.  Patient will be admitted to medicine service for monitoring overnight.    ____________________________________________   FINAL CLINICAL IMPRESSION(S) / ED DIAGNOSES  Final diagnoses:  Epistaxis    Medications  enalapril (VASOTEC) tablet 5 mg (has no administration in time range)  triamterene-hydrochlorothiazide (DYAZIDE) 37.5-25 MG per capsule 1 capsule (has no administration in time range)  acetaminophen (TYLENOL) tablet 325 mg (has no administration in time range)    Or  acetaminophen (TYLENOL) suppository 325 mg (has no administration in time range)  ondansetron (ZOFRAN) tablet 4 mg (has no administration in time range)    Or  ondansetron (ZOFRAN) injection 4 mg (has no  administration in time range)  metoprolol tartrate (LOPRESSOR) injection 5 mg (has no administration in time range)  ondansetron (ZOFRAN) injection 4 mg (4 mg Intravenous Given 11/30/20 1552)  labetalol (NORMODYNE) injection  5 mg (5 mg Intravenous Given 11/30/20 1552)  ondansetron (ZOFRAN) injection 4 mg (4 mg Intravenous Given 11/30/20 1732)  fentaNYL (SUBLIMAZE) injection 50 mcg (50 mcg Intravenous Given 11/30/20 1738)  fentaNYL (SUBLIMAZE) injection 50 mcg (50 mcg Intravenous Given 11/30/20 1838)     ED Discharge Orders    None       Note:  This document was prepared using Dragon voice recognition software and may include unintentional dictation errors.   Lucrezia Starch, MD 11/30/20 [phone]

## 2020-11-30 NOTE — ED Notes (Signed)
Both nares with rhino packing in place.  Pt anxious.  Family with pt.

## 2020-11-30 NOTE — ED Notes (Signed)
Bleeding from both nares again   md aware.

## 2020-11-30 NOTE — ED Triage Notes (Signed)
BIB [facility] from golfing where pt developed a nose bleed. Reports frequent nose bleeds over last 4 days--had one this AM before golfing. Pt reports headache that proceeded the nose bleed 3/10 that still continues. Hypertensive with EMS. Pt with hx of hypertension, no missed mediations or changes in mediations. Takes 81mg  ASA daily. 2 sprays [place] to each nostril by EMS PTA, clamp in place. Spitting up small amount of bright red blood, reporting blood clots.

## 2020-11-30 NOTE — ED Notes (Signed)
Dr cox in with pt and family

## 2020-11-30 NOTE — ED Notes (Signed)
Nose clamp in place  meds given.  Pt alert.  md in with pt and family.

## 2020-11-30 NOTE — ED Notes (Signed)
md in with pt again  

## 2020-12-01 ENCOUNTER — Encounter: Payer: Self-pay | Admitting: Internal Medicine

## 2020-12-01 DIAGNOSIS — R040 Epistaxis: Secondary | ICD-10-CM | POA: Diagnosis not present

## 2020-12-01 DIAGNOSIS — I10 Essential (primary) hypertension: Secondary | ICD-10-CM

## 2020-12-01 LAB — BASIC METABOLIC PANEL
Anion gap: 9 (ref 5–15)
BUN: 26 mg/dL — ABNORMAL HIGH (ref 8–23)
CO2: 28 mmol/L (ref 22–32)
Calcium: 9 mg/dL (ref 8.9–10.3)
Chloride: 102 mmol/L (ref 98–111)
Creatinine, Ser: 0.93 mg/dL (ref 0.44–1.00)
GFR, Estimated: >60 mL/min (ref >60)
Glucose, Bld: 152 mg/dL — ABNORMAL HIGH (ref 70–99)
Potassium: 3.5 mmol/L (ref 3.5–5.1)
Sodium: 139 mmol/L (ref 135–145)

## 2020-12-01 LAB — CBC
HCT: 44.3 % (ref 36.0–46.0)
Hemoglobin: 14.2 g/dL (ref 12.0–15.0)
MCH: 29.4 pg (ref 26.0–34.0)
MCHC: 32.1 g/dL (ref 30.0–36.0)
MCV: 91.7 fL (ref 80.0–100.0)
Platelets: 195 10*3/uL (ref 150–400)
RBC: 4.83 MIL/uL (ref 3.87–5.11)
RDW: 13.6 % (ref 11.5–15.5)
WBC: 12.7 10*3/uL — ABNORMAL HIGH (ref 4.0–10.5)
nRBC: 0 % (ref 0.0–0.2)

## 2020-12-01 LAB — HIV ANTIBODY (ROUTINE TESTING W REFLEX): HIV Screen 4th Generation wRfx: NONREACTIVE

## 2020-12-01 LAB — SARS CORONAVIRUS 2 (TAT 6-24 HRS): SARS Coronavirus 2: NEGATIVE

## 2020-12-01 MED ORDER — MORPHINE SULFATE (PF) 2 MG/ML IV SOLN
2.0000 mg | INTRAVENOUS | Status: DC | PRN
  Administered 2020-12-01 – 2020-12-02 (×3): 2 mg via INTRAVENOUS
  Filled 2020-12-01 (×4): qty 1

## 2020-12-01 MED ORDER — AMOXICILLIN-POT CLAVULANATE 875-125 MG PO TABS
1.0000 | ORAL_TABLET | Freq: Two times a day (BID) | ORAL | Status: DC
  Administered 2020-12-01 – 2020-12-03 (×3): 1 via ORAL
  Filled 2020-12-01 (×4): qty 1

## 2020-12-01 MED ORDER — POLYETHYLENE GLYCOL 3350 17 G PO PACK
17.0000 g | PACK | Freq: Every day | ORAL | Status: DC
  Administered 2020-12-02: 17 g via ORAL
  Filled 2020-12-01 (×3): qty 1

## 2020-12-01 NOTE — Evaluation (Signed)
Physical Therapy Evaluation Patient Details Name: Stacy Strong MRN: [id number] DOB: 1955-11-08 Today's Date: 12/01/2020   History of Present Illness  Stacy Strong is a 65 y.o. female with medical history significant for hypertension, presents to the ED for chief concerns of nosebleed.     Clinical Impression  Pt received in Semi-Fowler's position and agreeable to therapy. Husband was in room for treatment session.  Pt performed bed-level exercises with good technique and some notable fatigue setting in with muscle quivering noted during SLR and Abduction bilaterally.  Pt was able to perform bed mobility to arrive in standing position with modI utilizing handrails and verbal cuing from therapist in order to have good hand placement.  Pt then utilized a FWW to stand with CGA.  Pt was given morphine during bed-level exercises and pt noted some "woozy" feeling upon standing.  Pt continued to stand and wait for symptoms to resolve. Pt then ambulated with diminished gait speed approximately 85 feet and back to the room.  Pt requested to use the [facility], and was assisted with verbal cuing for hand placement and how to transfer with use of FWW.  Pt performed self-care with assistance from therapist to remain in upright position.  Pt then transerred back to bed with all needs met and bed alarm set, call bell within arms reach.  Pt will continue to benefit from skilled therapy during hospital stay to address strength deficits in order to return to PLOF and discharge to home with HHPT.       Follow Up Recommendations Home health PT;Supervision for mobility/OOB    Equipment Recommendations  Rolling walker with 5" wheels    Recommendations for Other Services       Precautions / Restrictions Precautions Precautions: Fall Restrictions Weight Bearing Restrictions: No      Mobility  Bed Mobility Overal bed mobility: Modified Independent                  Transfers Overall transfer level:  Needs assistance Equipment used: Rolling walker (2 wheeled) Transfers: Sit to/from Stand Sit to Stand: Min guard Stand pivot transfers: Min assist       General transfer comment: Pt very slow with transfers likely due to morphine medication given during therapy.  Ambulation/Gait Ambulation/Gait assistance: Min assist Gait Distance (Feet): 85 Feet Assistive device: Rolling walker (2 wheeled) Gait Pattern/deviations: Step-through pattern;Decreased step length - right;Decreased step length - left;Decreased stride length Gait velocity: significantly decreased.   General Gait Details: Pt ambulates extremely slow, almost freezing at times.  Pt notes her head to be hurting the most while performed ambulation.  Also endorses fatigue from decrease hemoglobin.  Stairs            Wheelchair Mobility    Modified Rankin (Stroke Patients Only)       Balance Overall balance assessment: Needs assistance Sitting-balance support: No upper extremity supported;Feet supported Sitting balance-Leahy Scale: Good     Standing balance support: Bilateral upper extremity supported Standing balance-Leahy Scale: Fair                               Pertinent Vitals/Pain Pain Assessment: 0-10 Pain Score: 8  Pain Location: headache Pain Descriptors / Indicators: Headache Pain Intervention(s): Limited activity within patient's tolerance;Monitored during session;Repositioned    Home Living Family/patient expects to be discharged to:: Private residence Living Arrangements: Spouse/significant other Available Help at Discharge: Family;Available 24 hours/day (husband and daughter Investment banker, corporate))  Type of Home: Mobile home Home Access: Stairs to enter Entrance Stairs-Rails: Can reach both (B rails for 4, none for 2) Entrance Stairs-Number of Steps: 4 to porch then 2 to enter Home Layout: One level        Prior Function Level of Independence: Independent         Comments: Drives, golfs      Hand Dominance   Dominant Hand: Right    Extremity/Trunk Assessment   Upper Extremity Assessment Upper Extremity Assessment: Defer to OT evaluation    Lower Extremity Assessment Lower Extremity Assessment: Generalized weakness       Communication   Communication: No difficulties  Cognition Arousal/Alertness: Awake/alert Behavior During Therapy: WFL for tasks assessed/performed Overall Cognitive Status: Within Functional Limits for tasks assessed                                 General Comments: Difficult to understand at times due to nasal device and sore throat.      General Comments General comments (skin integrity, edema, etc.): SEATED: BP 152/75, HR 83. STANDING: BP 138/68, HR 98    Exercises Total Joint Exercises Ankle Circles/Pumps: AROM;Strengthening;Both;10 reps;Supine Quad Sets: AROM;Strengthening;Both;10 reps;Supine Gluteal Sets: AROM;Strengthening;Both;10 reps;Supine Heel Slides: AROM;Strengthening;Both;10 reps;Supine Hip ABduction/ADduction: AROM;Strengthening;Both;10 reps;Supine Straight Leg Raises: AROM;Strengthening;Both;10 reps;Supine Marching in Standing: AROM;Strengthening;Both;10 reps;Supine Other Exercises Other Exercises: Pt and spouse educated re: OT role, DME recs, d/c recs, falls prevention, ECS, home/routines modifications Other Exercises: LBD, toileting, sup<>sit, sit<>stand, sitting/standing balance/tolerance, ~20 ft in room mobility Other Exercises: Pt and spouse educated on role of PT and service provided during hospital stay.  Pt also encouraged and educated on the importance to perform exercises while in the hospital. Other Exercises: STS, toileting, standing balance   Assessment/Plan    PT Assessment Patient needs continued PT services  PT Problem List Decreased strength;Decreased activity tolerance;Decreased balance;Decreased mobility       PT Treatment Interventions DME instruction;Gait training;Stair  training;Functional mobility training;Therapeutic activities;Therapeutic exercise;Balance training;Neuromuscular re-education    PT Goals (Current goals can be found in the Care Plan section)  Acute Rehab PT Goals Patient Stated Goal: To improve pain PT Goal Formulation: With patient Time For Goal Achievement: 12/15/20 Potential to Achieve Goals: Good    Frequency Min 2X/week   Barriers to discharge Inaccessible home environment Pt would have significant diffiulty navigating the stairs with current status.    Co-evaluation               AM-PAC PT "6 Clicks" Mobility  Outcome Measure Help needed turning from your back to your side while in a flat bed without using bedrails?: A Little Help needed moving from lying on your back to sitting on the side of a flat bed without using bedrails?: A Little Help needed moving to and from a bed to a chair (including a wheelchair)?: A Little Help needed standing up from a chair using your arms (e.g., wheelchair or bedside chair)?: A Little Help needed to walk in hospital room?: A Lot Help needed climbing 3-5 steps with a railing? : A Lot 6 Click Score: 16    End of Session Equipment Utilized During Treatment: Gait belt Activity Tolerance: Patient tolerated treatment well;Patient limited by fatigue Patient left: in bed;with call bell/phone within reach;with bed alarm set;with family/visitor present Nurse Communication: Mobility status PT Visit Diagnosis: Unsteadiness on feet (R26.81);Other abnormalities of gait and mobility (R26.89);Muscle weakness (generalized) (M62.81);Difficulty in walking, not  elsewhere classified (R26.2)    Time: 3094-0768 PT Time Calculation (min) (ACUTE ONLY): 55 min   Charges:   PT Evaluation $PT Eval Low Complexity: 1 Low PT Treatments $Gait Training: 23-37 mins $Therapeutic Exercise: 8-22 mins $Self Care/Home Management: 8-22        Gwenlyn Saran, PT, DPT 12/01/20, 5:15 PM

## 2020-12-01 NOTE — Plan of Care (Signed)
°  Problem: Clinical Measurements: °Goal: Ability to maintain clinical measurements within normal limits will improve °Outcome: Progressing °Goal: Will remain free from infection °Outcome: Progressing °Goal: Diagnostic test results will improve °Outcome: Progressing °Goal: Respiratory complications will improve °Outcome: Progressing °Goal: Cardiovascular complication will be avoided °Outcome: Progressing °  °Problem: Activity: °Goal: Risk for activity intolerance will decrease °Outcome: Progressing °  °Problem: Nutrition: °Goal: Adequate nutrition will be maintained °Outcome: Progressing °  °Problem: Coping: °Goal: Level of anxiety will decrease °Outcome: Progressing °  °Problem: Elimination: °Goal: Will not experience complications related to bowel motility °Outcome: Progressing °Goal: Will not experience complications related to urinary retention °Outcome: Progressing °  °Problem: Pain Managment: °Goal: General experience of comfort will improve °Outcome: Progressing °  °Problem: Safety: °Goal: Ability to remain free from injury will improve °Outcome: Progressing °  °

## 2020-12-01 NOTE — Evaluation (Signed)
Occupational Therapy Evaluation Patient Details Name: Stacy Strong MRN: [id number] DOB: 1956/07/03 Today's Date: 12/01/2020    History of Present Illness Stacy Strong is a 65 y.o. female with medical history significant for hypertension, presents to the ED for chief concerns of nosebleed.   Clinical Impression   Stacy Strong was seen for OT evaluation this date. Prior to hospital admission, pt was Independent for mobility and I/ADLs. Pt lives c husband in mobile home c 4 STE + B rails to porch then 2 STE no rails. Pt presents to acute OT demonstrating impaired ADL performance and functional mobility 2/2 decreased activity tolerance, functional strength/balance deficits, and pain. Pt endorses 8/10 headache t/o session, RN notified. Pt endorses dizziness with head turns, requires HHA + CGA for mobility - pt reports having RW at home if needed.   Pt currently requires CGA + HHA for toilet t/f, SUPERVISION perihygiene with lateral leans. MOD I don/doff B socks seated EOB - increased time 2/2 pain with head positional changes. Anticipate return to Independence as pain improves, husband at bedside and daughter (retired Therapist, sports) on phone confirm pt will have assistance at home. Pt would benefit from skilled OT to address noted impairments and functional limitations (see below for any additional details) in order to maximize safety and independence while minimizing falls risk and caregiver burden. Upon hospital discharge, recommend no OT follow up.  Orthostatics Seated: BP 152/75, HR 83 Standing: BP 138/68, HR 98    Follow Up Recommendations  No OT follow up;Supervision - Intermittent    Equipment Recommendations  3 in 1 bedside commode    Recommendations for Other Services       Precautions / Restrictions Precautions Precautions: Fall Restrictions Weight Bearing Restrictions: No      Mobility Bed Mobility Overal bed mobility: Modified Independent                  Transfers Overall  transfer level: Needs assistance Equipment used: 1 person hand held assist Transfers: Sit to/from Stand;Stand Pivot Transfers Sit to Stand: Min guard Stand pivot transfers: Min assist            Balance Overall balance assessment: Needs assistance Sitting-balance support: No upper extremity supported;Feet supported Sitting balance-Leahy Scale: Good     Standing balance support: Single extremity supported Standing balance-Leahy Scale: Fair                             ADL either performed or assessed with clinical judgement   ADL Overall ADL's : Needs assistance/impaired                                       General ADL Comments: CGA + HHA for toilet t/f, SUPERVISION perihygiene with lateral leans. MOD I don/doff B socks seated EOB - increased time 2/2 pain with head positional changes.                  Pertinent Vitals/Pain Pain Assessment: 0-10 Pain Score: 8  Pain Location: headache Pain Descriptors / Indicators: Headache Pain Intervention(s): Limited activity within patient's tolerance;Repositioned;Patient requesting pain meds-RN notified     Hand Dominance Right   Extremity/Trunk Assessment Upper Extremity Assessment Upper Extremity Assessment: Overall WFL for tasks assessed   Lower Extremity Assessment Lower Extremity Assessment: Generalized weakness       Communication Communication Communication: No difficulties  Cognition Arousal/Alertness: Awake/alert Behavior During Therapy: WFL for tasks assessed/performed Overall Cognitive Status: Within Functional Limits for tasks assessed                                     General Comments  SEATED: BP 152/75, HR 83. STANDING: BP 138/68, HR 98    Exercises Exercises: Other exercises Other Exercises Other Exercises: Pt and spouse educated re: OT role, DME recs, d/c recs, falls prevention, ECS, home/routines modifications Other Exercises: LBD, toileting,  sup<>sit, sit<>stand, sitting/standing balance/tolerance, ~20 ft in room mobility   Shoulder Instructions      Home Living Family/patient expects to be discharged to:: Private residence Living Arrangements: Spouse/significant other Available Help at Discharge: Family;Available 24 hours/day (husband and daughter Investment banker, corporate)) Type of Home: Mobile home Home Access: Stairs to enter [employer] of Steps: 4 to porch then 2 to enter Entrance Stairs-Rails: Can reach both (B rails for 4, none for 2) Home Layout: One level     Bathroom Shower/Tub: Walk-in shower                    Prior Functioning/Environment Level of Independence: Independent        Comments: Drives, golfs        OT Problem List: Decreased strength;Decreased activity tolerance;Impaired balance (sitting and/or standing)      OT Treatment/Interventions: Self-care/ADL training;Therapeutic exercise;DME and/or AE instruction;Energy conservation;Therapeutic activities;Patient/family education;Balance training    OT Goals(Current goals can be found in the care plan section) Acute Rehab OT Goals Patient Stated Goal: To improve pain OT Goal Formulation: With patient/family Time For Goal Achievement: 12/15/20 Potential to Achieve Goals: Good ADL Goals Pt Will Perform Grooming: Independently;standing Pt Will Transfer to Toilet: Independently;ambulating;regular height toilet Additional ADL Goal #1: Pt will Independently verbalize plan to implement x3 falls prevention strategies  OT Frequency: Min 1X/week    AM-PAC OT "6 Clicks" Daily Activity     Outcome Measure Help from another person eating meals?: None Help from another person taking care of personal grooming?: A Little Help from another person toileting, which includes using toliet, bedpan, or urinal?: A Little Help from another person bathing (including washing, rinsing, drying)?: A Little Help from another person to put on and taking off regular upper  body clothing?: None Help from another person to put on and taking off regular lower body clothing?: None 6 Click Score: 21   End of Session Nurse Communication: Patient requests pain meds  Activity Tolerance: Patient tolerated treatment well Patient left: in bed;with call bell/phone within reach;with bed alarm set;with family/visitor present  OT Visit Diagnosis: Other abnormalities of gait and mobility (R26.89)                Time: 7829-5621 OT Time Calculation (min): 37 min Charges:  OT General Charges $OT Visit: 1 Visit OT Evaluation $OT Eval Moderate Complexity: 1 Mod OT Treatments $Self Care/Home Management : 23-37 mins  Dessie Coma, M.S. OTR/L  12/01/20, 3:31 PM  ascom [phone]

## 2020-12-01 NOTE — Progress Notes (Signed)
Progress Note    JESELLE Strong  KGU:[id number] DOB: 09-Dec-1955  DOA: 11/30/2020 PCP: Stacy Curry, MD      Brief Narrative:    Medical records reviewed and are as summarized below:  Stacy Strong is a 65 y.o. female       Assessment/Plan:   Active Problems:   Bleeding from the nose   Essential hypertension    Body mass index is 36.32 kg/m.  (Morbid obesity)   Epistaxis: S/p bilateral nasal packing per ED physician on 11/30/2020.  Start Augmentin.  Continue analgesics for pain.  Patient requested to see ENT physician because she did not want to go home with nasal packing.  I notified Dr. Richardson Landry, ENT physician on-call, who said that it will not be advisable to remove nasal packing at this time.  He recommended the patient be discharged with nasal packing with outpatient follow-up in the office for removal.  Hypertension: Continue HCTZ.  Other comorbidities include OSA on CPAP, GERD and seasonal allergies  Diet Order            Diet NPO time specified  Diet effective now                    Consultants:  None  Procedures:  None    Medications:   . triamterene-hydrochlorothiazide  1 capsule Oral Daily   Continuous Infusions:   Anti-infectives (From admission, onward)   None             Family Communication/Anticipated D/C date and plan/Code Status   DVT prophylaxis: Place TED hose Start: 11/30/20 1828     Code Status: Full Code  Family Communication: Discussed with her husband and daughter Stacy Strong) at the bedside Disposition Plan:    Status is: Observation  The patient will require care spanning > 2 midnights and should be moved to inpatient because: Inpatient level of care appropriate due to severity of illness  Dispo: The patient is from: Home              Anticipated d/c is to: Home              Patient currently is not medically stable to d/c.   Difficult to place patient No           Subjective:    Interval events noted.  She complains of blood dripping from the right nose around the nasal packing.  Objective:    Vitals:   11/30/20 2040 12/01/20 0028 12/01/20 0551 12/01/20 0749  BP: (!) 171/66 (!) 141/62 (!) 122/50 (!) 148/57  Pulse: 62 76 62 74  Resp: (!) 22 20 16 19   Temp: 97.8 F (36.6 C) 98.4 F (36.9 C) 98.4 F (36.9 C) 98.6 F (37 C)  TempSrc: Oral Oral Oral   SpO2: 97% 99% 99% 96%  Weight:  93 kg    Height:  5\' 3"  (1.6 m)     No data found.   Intake/Output Summary (Last 24 hours) at 12/01/2020 1001 Last data filed at 12/01/2020 0551 Gross per 24 hour  Intake --  Output 0 ml  Net 0 ml   Filed Weights   11/30/20 1449 12/01/20 0028  Weight: 93 kg 93 kg    Exam:  GEN: NAD SKIN: Warm and dry.  No rash noted. EYES: EOMI ENT: MMM, bilateral nasal packing  CV: RRR PULM: CTA B ABD: soft, obese, NT, +BS CNS: AAO x 3, non focal EXT: No edema or tenderness  Data Reviewed:   I have personally reviewed following labs and imaging studies:  Labs: Labs show the following:   Basic Metabolic Panel: Recent Labs  Lab 11/30/20 1520 12/01/20 0520  NA 137 139  K 3.9 3.5  CL 103 102  CO2 26 28  GLUCOSE 144* 152*  BUN 18 26*  CREATININE 0.87 0.93  CALCIUM 9.0 9.0   GFR Estimated Creatinine Clearance: 67.1 mL/min (by C-G formula based on SCr of 0.93 mg/dL). Liver Function Tests: No results for input(s): AST, ALT, ALKPHOS, BILITOT, PROT, ALBUMIN in the last 168 hours. No results for input(s): LIPASE, AMYLASE in the last 168 hours. No results for input(s): AMMONIA in the last 168 hours. Coagulation profile Recent Labs  Lab 11/30/20 1520  INR 1.1    CBC: Recent Labs  Lab 11/30/20 1520 12/01/20 0520  WBC 11.3* 12.7*  NEUTROABS 8.4*  --   HGB 14.3 14.2  HCT 42.5 44.3  MCV 86.4 91.7  PLT 273 195   Cardiac Enzymes: No results for input(s): CKTOTAL, CKMB, CKMBINDEX, TROPONINI in the last 168 hours. BNP (last 3 results) No results for  input(s): PROBNP in the last 8760 hours. CBG: No results for input(s): GLUCAP in the last 168 hours. D-Dimer: No results for input(s): DDIMER in the last 72 hours. Hgb A1c: No results for input(s): HGBA1C in the last 72 hours. Lipid Profile: No results for input(s): CHOL, HDL, LDLCALC, TRIG, CHOLHDL, LDLDIRECT in the last 72 hours. Thyroid function studies: No results for input(s): TSH, T4TOTAL, T3FREE, THYROIDAB in the last 72 hours.  Invalid input(s): FREET3 Anemia work up: No results for input(s): VITAMINB12, FOLATE, FERRITIN, TIBC, IRON, RETICCTPCT in the last 72 hours. Sepsis Labs: Recent Labs  Lab 11/30/20 1520 12/01/20 0520  WBC 11.3* 12.7*    Microbiology Recent Results (from the past 240 hour(s))  SARS CORONAVIRUS 2 (TAT 6-24 HRS) Nasopharyngeal Nasopharyngeal Swab     Status: None   Collection Time: 11/30/20  6:34 PM   Specimen: Nasopharyngeal Swab  Result Value Ref Range Status   SARS Coronavirus 2 NEGATIVE NEGATIVE Final    Comment: (NOTE) SARS-CoV-2 target nucleic acids are NOT DETECTED.  The SARS-CoV-2 RNA is generally detectable in upper and lower respiratory specimens during the acute phase of infection. Negative results do not preclude SARS-CoV-2 infection, do not rule out co-infections with other pathogens, and should not be used as the sole basis for treatment or other patient management decisions. Negative results must be combined with clinical observations, patient history, and epidemiological information. The expected result is Negative.  Fact Sheet for Patients: SugarRoll.be  Fact Sheet for Healthcare Providers: https://www.woods-mathews.com/  This test is not yet approved or cleared by the [place] FDA and  has been authorized for detection and/or diagnosis of SARS-CoV-2 by FDA under an Emergency Use Authorization (EUA). This EUA will remain  in effect (meaning this test can be used) for the  duration of the COVID-19 declaration under Se ction 564(b)(1) of the Act, 21 U.S.C. section 360bbb-3(b)(1), unless the authorization is terminated or revoked sooner.  Performed at [facility], [place] [address], [place], [place] [address]     Procedures and diagnostic studies:  No results found.             LOS: 0 days   Onis Markoff  [facility]   Pager on [email or web address]. If 7PM-7AM, please contact night-coverage at [email or web address]     12/01/2020, 10:01 AM

## 2020-12-02 DIAGNOSIS — R040 Epistaxis: Secondary | ICD-10-CM | POA: Diagnosis not present

## 2020-12-02 DIAGNOSIS — I10 Essential (primary) hypertension: Secondary | ICD-10-CM | POA: Diagnosis not present

## 2020-12-02 MED ORDER — MORPHINE SULFATE (PF) 2 MG/ML IV SOLN
1.0000 mg | Freq: Once | INTRAVENOUS | Status: AC
  Administered 2020-12-02: 1 mg via INTRAVENOUS

## 2020-12-02 MED ORDER — DEXTROSE-NACL 5-0.9 % IV SOLN: INTRAVENOUS | Status: DC

## 2020-12-02 MED ORDER — TRIAMTERENE-HCTZ 37.5-25 MG PO TABS
1.0000 | ORAL_TABLET | Freq: Every day | ORAL | Status: DC
  Administered 2020-12-02: 1 via ORAL
  Filled 2020-12-02 (×2): qty 1

## 2020-12-02 NOTE — Progress Notes (Signed)
Clinical/Bedside Swallow Evaluation Patient Details  Name: MALEY VENEZIA MRN: [id number] Date of Birth: 11-05-1955  Today's Date: 12/02/2020 Time: SLP Start Time (ACUTE ONLY): 84 SLP Stop Time (ACUTE ONLY): 1545 SLP Time Calculation (min) (ACUTE ONLY): 35 min  Past Medical History:  Past Medical History:  Diagnosis Date   Allergic state    Hypertension    Past Surgical History:  Past Surgical History:  Procedure Laterality Date   ABDOMINAL HYSTERECTOMY     BREAST BIOPSY Left 01/12/2014    FOCAL MICROCALCIFICATIONS IN FIBROCYSTIC CHANGES.    BREAST SURGERY Left    breast bx.with chip placement   COLONOSCOPY N/A 10/24/2016   Procedure: COLONOSCOPY;  Surgeon: Lollie Sails, MD;  Location: [facility] ENDOSCOPY;  Service: Endoscopy;  Laterality: N/A;   HPI:  Pt is 65 y.o. female admitted after 1 week of intermittent nosebleeds, packed in ER x2. Reported dysphagia, odynophagia s/p bilateral pack placement. ENT was consulted and removed nasal balloon that had migrated with tip in oropharynx.   Assessment / Plan / Recommendation Clinical Impression   Patient reports dysphagia has improved since ENT procedure; she still has odynophagia, rates as 5/10 when swallowing. Patient oral motor examination reveals functional strength and range of motion. She denies history of swallowing problems prior to admission. She consumed thin liquids, puree, soft and regular solids without overt signs of aspiration, however she reports worsening odynophagia with dry solid. With texture modification (cereal softened in milk), patient tolerated solids without increased pain. Appears with minimal risk for aspiration when following general aspiration precautions. Recommend mechanical soft (dysphagia 3) diet with thin liquids, straws OK. Pt wishes to continue meds crushed in puree at this time, but RN may attempt whole with liquid if she requests this. Pt/spouse continue with some concerns due to the swallowing  difficulties she was having earlier today; SLP will follow up x1 for diet tolerance.     SLP Visit Diagnosis: Dysphagia, unspecified (R13.10)    Aspiration Risk  No limitations    Diet Recommendation Dysphagia 3 (Mech soft);Thin liquid   Liquid Administration via: Cup;Straw Medication Administration: Crushed with puree (per pt preference; can do whole with liquid if she requests) Supervision: Patient able to self feed Compensations: Slow rate;Small sips/bites Postural Changes: Seated upright at 90 degrees    Other  Recommendations Oral Care Recommendations: Oral care BID   Follow up Recommendations None      Frequency and Duration min 1 x/week  1 week       Prognosis Prognosis for Safe Diet Advancement: Good      Swallow Study   General Date of Onset: 11/30/20 HPI: Pt is 65 y.o. female admitted after 1 week of intermittent nosebleeds, packed in ER x2. Reported dysphagia, odynophagia s/p bilateral pack placement. ENT was consulted and removed nasal balloon that had migrated with tip in oropharynx. Type of Study: Bedside Swallow Evaluation Previous Swallow Assessment: none on file Diet Prior to this Study: Thin liquids (clear liquids) Temperature Spikes Noted: No Respiratory Status: Room air History of Recent Intubation: No Behavior/Cognition: Alert;Cooperative Oral Cavity Assessment: Within Functional Limits (has been expectorating blood after epistaxis) Oral Care Completed by SLP: Recent completion by staff Oral Cavity - Dentition: Adequate natural dentition Vision: Functional for self-feeding Self-Feeding Abilities: Able to feed self Patient Positioning: Upright in bed Baseline Vocal Quality: Normal Volitional Cough: Strong Volitional Swallow: Able to elicit    Oral/Motor/Sensory Function Overall Oral Motor/Sensory Function: Within functional limits   Ice Chips Ice chips: Not tested  Thin Liquid Thin Liquid: Within functional limits Presentation: Straw Other  Comments: pain 5/10    Nectar Thick Nectar Thick Liquid: Not tested   Honey Thick Honey Thick Liquid: Not tested   Puree Puree: Within functional limits Presentation: Self Fed;Spoon Other Comments: pain 5/10   Solid     Solid: Within functional limits Presentation: Self Fed Other Comments: grimmaces, increased pain per pt     Deneise Lever, MS, Omar  Aliene Altes 12/02/2020,3:54 PM

## 2020-12-02 NOTE — Consult Note (Signed)
July, [name] [id number] Jan 30, 1956 Stacy Boroughs, MD  Reason for Consult: epistaxis  HPI: 65 y.o. female admitted after 1 week of intermittent nose bleeds on 11/30/2020.  Patient was packed in ER twice with second time being anterior/posterior balloon packs.  Patient reports difficulty swallowing and weakness and pain since bilateral pack placement.  She wishes to have packs removed if possible.  Reports facial pain, dysphagia, odynophagia, weakness.  Allergies: No Known Allergies  ROS: Review of systems normal other than 12 systems except per HPI.  PMH:  Past Medical History:  Diagnosis Date  . Allergic state   . Hypertension     FH:  Family History  Problem Relation Age of Onset  . Breast cancer Maternal Aunt     SH:  Social History   Socioeconomic History  . Marital status: Married    Spouse name: Not on file  . Number of children: Not on file  . Years of education: Not on file  . Highest education level: Not on file  Occupational History  . Not on file  Tobacco Use  . Smoking status: Never Smoker  . Smokeless tobacco: Never Used  Substance and Sexual Activity  . Alcohol use: No  . Drug use: No  . Sexual activity: Not on file  Other Topics Concern  . Not on file  Social History Narrative  . Not on file   Social Determinants of Health   Financial Resource Strain: Not on file  Food Insecurity: Not on file  Transportation Needs: Not on file  Physical Activity: Not on file  Stress: Not on file  Social Connections: Not on file  Intimate Partner Violence: Not on file    PSH:  Past Surgical History:  Procedure Laterality Date  . ABDOMINAL HYSTERECTOMY    . BREAST BIOPSY Left 01/12/2014    FOCAL MICROCALCIFICATIONS IN FIBROCYSTIC CHANGES.   Marland Kitchen BREAST SURGERY Left    breast bx.with chip placement  . COLONOSCOPY N/A 10/24/2016   Procedure: COLONOSCOPY;  Surgeon: Lollie Sails, MD;  Location: [facility] ENDOSCOPY;  Service: Endoscopy;  Laterality: N/A;     Physical  Exam:  GEN-  Mild distress from pain of nasal packing NEURO- CN 2-12 grossly intact and symmetric. EARS-  External ears clear NOSE-  Bilateral AP packs in place.  Right with posterior migration and end of tip visible in patient's ororpharynx.  Left side in place but with pressure on patient's nasal sill causing discoloration of skin.  No active bleeding OC/OP- old blood in posterior aspect but no active bleeding. NECK- No LAD RESP- unlabored CARD-  RRR  Procedure:  Nasal Endoscopy with control of nasal hemorrhage:  Pre-procedure diagnosis epistaxis.  Post-procedure diagnosis same.  Descriptions of procedure:  After verbal consent obtained the patient's right packing removed as it had migrated posteriorly.  This demonstrated mild bleeding from mid septum.  Nasal endoscopy revealed old blood along nasal floor and some new blood from mid-septum.  After gelonasal and lidocaine was placed against this area for several minutes, silver nitrate cautery was performed with good control of bleeding.  After this was perfomed the left AP pack was removed due to discoloration of external nasal sill skin where is was in contact with plastic of AP balloon.  Nasal endoscopy revealed a left sided septal deviation and mild oozing present from posterior to deviation of mid-septum.  This was observed but did not improve over time and with Afrin.  Therefore a 10cm Merocel sponge was placed within the left  nasal cavity with good fit.  Patient tolerated well.   A/P: Epistaxis s/p nasal endoscopy and cautery on right and nasal endoscopy and re-packing on left.  Plan:  Patient will need antibiotic coverage for gram positives.  Will plan on re-evaluating patient tomorrow and most likely removal of pack as outpatient on Monday.   Stacy Strong 12/02/2020 1:02 PM

## 2020-12-02 NOTE — TOC Progression Note (Signed)
Transition of Care [facility]) - Progression Note    Patient Details  Name: Stacy Strong MRN: [id number] Date of Birth: [birth year]  Transition of Care [facility]) CM/SW [place], RN Phone Number: 12/02/2020, 10:01 AM  Clinical Narrative:  RNCM attempted to reach out to patient without success. RNCM reached out to patient's husband Jeneen Rinks and explained the reason for the call and the recommendations for home health. Patient's husband reports patient is in the middle of a bath and she won't be going anywhere today and that this RNCM should call back at a later time in the day or tomorrow.          Expected Discharge Plan and Services                                                 Social Determinants of Health (SDOH) Interventions    Readmission Risk Interventions No flowsheet data found.

## 2020-12-02 NOTE — Progress Notes (Signed)
Progress Note    Stacy Strong  EPP:[id number] DOB: 08/16/1956  DOA: 11/30/2020 PCP: Gayland Curry, MD      Brief Narrative:    Medical records reviewed and are as summarized below:  Stacy Strong is a 65 y.o. female       Assessment/Plan:   Active Problems:   Bleeding from the nose   Essential hypertension    Body mass index is 36.32 kg/m.  (Morbid obesity)   Epistaxis: S/p bilateral nasal packing by ED physician on 11/30/2020.  Consulted otolaryngologist Dr. Pryor Ochoa, for evaluation.  He performed nasal endoscopy and cautery on the right and nasal endoscopy and repacking on the left on 12/02/2020.  Continue analgesics as needed for pain.  Continue Augmentin.  Dysphagia and odynophagia: This is likely related to recent nasal packing.  Consulted speech therapist for further evaluation.  IV fluids has been started for hydration.  Hypertension: Continue HCTZ  Other comorbidities include OSA on CPAP, GERD, seasonal allergies.   Plan of care was discussed with her husband at the bedside and her 2 daughters [name]) who were on speaker phone.     Diet Order            Diet clear liquid Room service appropriate? Yes; Fluid consistency: Thin  Diet effective now                    Consultants:  Otolaryngologist  Procedures:  Nasal endoscopy and repacking on 12/02/2020  Initial nasal packing done in the ED on 11/30/2020    Medications:   . amoxicillin-clavulanate  1 tablet Oral Q12H  . polyethylene glycol  17 g Oral Daily  . triamterene-hydrochlorothiazide  1 capsule Oral Daily   Continuous Infusions:   Anti-infectives (From admission, onward)   Start     Dose/Rate Route Frequency Ordered Stop   12/01/20 1300  amoxicillin-clavulanate (AUGMENTIN) 875-125 MG per tablet 1 tablet        1 tablet Oral Every 12 hours 12/01/20 1115               Family Communication/Anticipated D/C date and plan/Code Status   DVT prophylaxis: Place TED  hose Start: 11/30/20 1828     Code Status: Full Code  Family Communication: Discussed with husband and 2 daughters Disposition Plan:    Status is: Observation  The patient will require care spanning > 2 midnights and should be moved to inpatient because: IV treatments appropriate due to intensity of illness or inability to take PO and Inpatient level of care appropriate due to severity of illness  Dispo: The patient is from: Home              Anticipated d/c is to: Home              Patient currently is not medically stable to d/c.   Difficult to place patient No           Subjective:   C/o difficulty and pain on swallowing.  She also complains of headache, facial pain and pain from nasal packing.  She says she noticed small amount of blood dripping from the nose last night and this morning.  She is frustrated by lack of progress and she did not feel she was ready to go home today.  Her husband was at bedside and he also said patient could not go home like this.   Objective:    Vitals:   12/01/20 2108 12/02/20 0041 12/02/20 6063  12/02/20 0752  BP: 137/67 (!) 146/53 (!) 136/53 (!) 148/64  Pulse: 100 74 69 69  Resp: 18 20 18 18   Temp: 98.6 F (37 C) 97.8 F (36.6 C) 98.2 F (36.8 C) 98.1 F (36.7 C)  TempSrc: Oral Oral Oral Oral  SpO2: 96% 95% 98% 100%  Weight:      Height:       No data found.   Intake/Output Summary (Last 24 hours) at 12/02/2020 0927 Last data filed at 12/02/2020 0458 Gross per 24 hour  Intake 360 ml  Output 0 ml  Net 360 ml   Filed Weights   11/30/20 1449 12/01/20 0028  Weight: 93 kg 93 kg    Exam:  GEN: NAD SKIN: No rash EYES: EOMI ENT: MMM.  Nasal packing in place.  No active bleeding from the nose. CV: RRR PULM: CTA B ABD: soft, obese, NT, +BS CNS: AAO x 3, non focal EXT: No edema or tenderness        Data Reviewed:   I have personally reviewed following labs and imaging studies:  Labs: Labs show the following:    Basic Metabolic Panel: Recent Labs  Lab 11/30/20 1520 12/01/20 0520  NA 137 139  K 3.9 3.5  CL 103 102  CO2 26 28  GLUCOSE 144* 152*  BUN 18 26*  CREATININE 0.87 0.93  CALCIUM 9.0 9.0   GFR Estimated Creatinine Clearance: 67.1 mL/min (by C-G formula based on SCr of 0.93 mg/dL). Liver Function Tests: No results for input(s): AST, ALT, ALKPHOS, BILITOT, PROT, ALBUMIN in the last 168 hours. No results for input(s): LIPASE, AMYLASE in the last 168 hours. No results for input(s): AMMONIA in the last 168 hours. Coagulation profile Recent Labs  Lab 11/30/20 1520  INR 1.1    CBC: Recent Labs  Lab 11/30/20 1520 12/01/20 0520  WBC 11.3* 12.7*  NEUTROABS 8.4*  --   HGB 14.3 14.2  HCT 42.5 44.3  MCV 86.4 91.7  PLT 273 195   Cardiac Enzymes: No results for input(s): CKTOTAL, CKMB, CKMBINDEX, TROPONINI in the last 168 hours. BNP (last 3 results) No results for input(s): PROBNP in the last 8760 hours. CBG: No results for input(s): GLUCAP in the last 168 hours. D-Dimer: No results for input(s): DDIMER in the last 72 hours. Hgb A1c: No results for input(s): HGBA1C in the last 72 hours. Lipid Profile: No results for input(s): CHOL, HDL, LDLCALC, TRIG, CHOLHDL, LDLDIRECT in the last 72 hours. Thyroid function studies: No results for input(s): TSH, T4TOTAL, T3FREE, THYROIDAB in the last 72 hours.  Invalid input(s): FREET3 Anemia work up: No results for input(s): VITAMINB12, FOLATE, FERRITIN, TIBC, IRON, RETICCTPCT in the last 72 hours. Sepsis Labs: Recent Labs  Lab 11/30/20 1520 12/01/20 0520  WBC 11.3* 12.7*    Microbiology Recent Results (from the past 240 hour(s))  SARS CORONAVIRUS 2 (TAT 6-24 HRS) Nasopharyngeal Nasopharyngeal Swab     Status: None   Collection Time: 11/30/20  6:34 PM   Specimen: Nasopharyngeal Swab  Result Value Ref Range Status   SARS Coronavirus 2 NEGATIVE NEGATIVE Final    Comment: (NOTE) SARS-CoV-2 target nucleic acids are NOT  DETECTED.  The SARS-CoV-2 RNA is generally detectable in upper and lower respiratory specimens during the acute phase of infection. Negative results do not preclude SARS-CoV-2 infection, do not rule out co-infections with other pathogens, and should not be used as the sole basis for treatment or other patient management decisions. Negative results must be combined with clinical  observations, patient history, and epidemiological information. The expected result is Negative.  Fact Sheet for Patients: SugarRoll.be  Fact Sheet for Healthcare Providers: https://www.woods-mathews.com/  This test is not yet approved or cleared by the [place] FDA and  has been authorized for detection and/or diagnosis of SARS-CoV-2 by FDA under an Emergency Use Authorization (EUA). This EUA will remain  in effect (meaning this test can be used) for the duration of the COVID-19 declaration under Se ction 564(b)(1) of the Act, 21 U.S.C. section 360bbb-3(b)(1), unless the authorization is terminated or revoked sooner.  Performed at [facility], [place] [address] [place], [place] [address]     Procedures and diagnostic studies:  No results found.             LOS: 0 days   Shaylinn Hladik  [facility]   Pager on [email or web address]. If 7PM-7AM, please contact night-coverage at [email or web address]     12/02/2020, 9:27 AM

## 2020-12-02 NOTE — Plan of Care (Signed)
  Problem: Education: Goal: Knowledge of General Education information will improve Description: Including pain rating scale, medication(s)/side effects and non-pharmacologic comfort measures Outcome: Progressing   Problem: Clinical Measurements: Goal: Ability to maintain clinical measurements within normal limits will improve Outcome: Progressing Goal: Will remain free from infection Outcome: Progressing Goal: Diagnostic test results will improve Outcome: Progressing Goal: Respiratory complications will improve Outcome: Progressing Goal: Cardiovascular complication will be avoided Outcome: Progressing   Problem: Elimination: Goal: Will not experience complications related to bowel motility Outcome: Progressing Goal: Will not experience complications related to urinary retention Outcome: Progressing   Problem: Pain Managment: Goal: General experience of comfort will improve Outcome: Progressing

## 2020-12-03 DIAGNOSIS — K219 Gastro-esophageal reflux disease without esophagitis: Secondary | ICD-10-CM | POA: Diagnosis present

## 2020-12-03 DIAGNOSIS — Z20822 Contact with and (suspected) exposure to covid-19: Secondary | ICD-10-CM | POA: Diagnosis present

## 2020-12-03 DIAGNOSIS — G4733 Obstructive sleep apnea (adult) (pediatric): Secondary | ICD-10-CM | POA: Diagnosis present

## 2020-12-03 DIAGNOSIS — Z6836 Body mass index (BMI) 36.0-36.9, adult: Secondary | ICD-10-CM | POA: Diagnosis not present

## 2020-12-03 DIAGNOSIS — R1310 Dysphagia, unspecified: Secondary | ICD-10-CM | POA: Diagnosis present

## 2020-12-03 DIAGNOSIS — R519 Headache, unspecified: Secondary | ICD-10-CM | POA: Diagnosis present

## 2020-12-03 DIAGNOSIS — J302 Other seasonal allergic rhinitis: Secondary | ICD-10-CM | POA: Diagnosis present

## 2020-12-03 DIAGNOSIS — Z79818 Long term (current) use of other agents affecting estrogen receptors and estrogen levels: Secondary | ICD-10-CM | POA: Diagnosis not present

## 2020-12-03 DIAGNOSIS — R040 Epistaxis: Secondary | ICD-10-CM | POA: Diagnosis present

## 2020-12-03 DIAGNOSIS — Z79899 Other long term (current) drug therapy: Secondary | ICD-10-CM | POA: Diagnosis not present

## 2020-12-03 DIAGNOSIS — I10 Essential (primary) hypertension: Secondary | ICD-10-CM | POA: Diagnosis not present

## 2020-12-03 LAB — CBC WITH DIFFERENTIAL/PLATELET
Abs Immature Granulocytes: 0.03 10*3/uL (ref 0.00–0.07)
Basophils Absolute: 0.1 10*3/uL (ref 0.0–0.1)
Basophils Relative: 0 %
Eosinophils Absolute: 0.1 10*3/uL (ref 0.0–0.5)
Eosinophils Relative: 1 %
HCT: 36.4 % (ref 36.0–46.0)
Hemoglobin: 12.2 g/dL (ref 12.0–15.0)
Immature Granulocytes: 0 %
Lymphocytes Relative: 23 %
Lymphs Abs: 2.6 10*3/uL (ref 0.7–4.0)
MCH: 29.7 pg (ref 26.0–34.0)
MCHC: 33.5 g/dL (ref 30.0–36.0)
MCV: 88.6 fL (ref 80.0–100.0)
Monocytes Absolute: 1.1 10*3/uL — ABNORMAL HIGH (ref 0.1–1.0)
Monocytes Relative: 10 %
Neutro Abs: 7.3 10*3/uL (ref 1.7–7.7)
Neutrophils Relative %: 66 %
Platelets: 211 10*3/uL (ref 150–400)
RBC: 4.11 MIL/uL (ref 3.87–5.11)
RDW: 13.2 % (ref 11.5–15.5)
WBC: 11.1 10*3/uL — ABNORMAL HIGH (ref 4.0–10.5)
nRBC: 0 % (ref 0.0–0.2)

## 2020-12-03 MED ORDER — SALINE SPRAY 0.65 % NA SOLN
2.0000 | Freq: Three times a day (TID) | NASAL | Status: DC
  Filled 2020-12-03: qty 44

## 2020-12-03 MED ORDER — SALINE SPRAY 0.65 % NA SOLN: 2.0000 | Freq: Three times a day (TID) | NASAL | 0 refills | Status: AC

## 2020-12-03 MED ORDER — AMOXICILLIN-POT CLAVULANATE 875-125 MG PO TABS: 1.0000 | ORAL_TABLET | Freq: Two times a day (BID) | ORAL | 0 refills | Status: AC

## 2020-12-03 MED ORDER — FLUCONAZOLE 150 MG PO TABS: 150.0000 mg | ORAL_TABLET | Freq: Once | ORAL | 0 refills | Status: AC

## 2020-12-03 MED ORDER — FLUCONAZOLE 50 MG PO TABS
150.0000 mg | ORAL_TABLET | Freq: Once | ORAL | Status: DC
  Filled 2020-12-03: qty 1

## 2020-12-03 NOTE — Progress Notes (Signed)
..   12/03/2020 8:36 AM  Stacy Strong [id number]    Temp:  [97.4 F (36.3 C)-98.3 F (36.8 C)] 97.9 F (36.6 C) (03/04 0757) Pulse Rate:  [63-79] 64 (03/04 0757) Resp:  [18-20] 18 (03/04 0757) BP: (113-148)/(46-68) 124/59 (03/04 0757) SpO2:  [96 %-99 %] 99 % (03/04 0757),     Intake/Output Summary (Last 24 hours) at 12/03/2020 0836 Last data filed at 12/02/2020 1550 Gross per 24 hour  Intake 404.34 ml  Output --  Net 404.34 ml    Results for orders placed or performed during the hospital encounter of 11/30/20 (from the past 24 hour(s))  CBC with Differential/Platelet     Status: Abnormal   Collection Time: 12/03/20  4:51 AM  Result Value Ref Range   WBC 11.1 (H) 4.0 - 10.5 K/uL   RBC 4.11 3.87 - 5.11 MIL/uL   Hemoglobin 12.2 12.0 - 15.0 g/dL   HCT 36.4 36.0 - 46.0 %   MCV 88.6 80.0 - 100.0 fL   MCH 29.7 26.0 - 34.0 pg   MCHC 33.5 30.0 - 36.0 g/dL   RDW 13.2 11.5 - 15.5 %   Platelets 211 150 - 400 K/uL   nRBC 0.0 0.0 - 0.2 %   Neutrophils Relative % 66 %   Neutro Abs 7.3 1.7 - 7.7 K/uL   Lymphocytes Relative 23 %   Lymphs Abs 2.6 0.7 - 4.0 K/uL   Monocytes Relative 10 %   Monocytes Absolute 1.1 (H) 0.1 - 1.0 K/uL   Eosinophils Relative 1 %   Eosinophils Absolute 0.1 0.0 - 0.5 K/uL   Basophils Relative 0 %   Basophils Absolute 0.1 0.0 - 0.1 K/uL   Immature Granulocytes 0 %   Abs Immature Granulocytes 0.03 0.00 - 0.07 K/uL    SUBJECTIVE:  No acute events.  Tolerating PO.  Evaluated by speech and ok for soft diet.  Minimal old blood.  Improved throat pain.  OBJECTIVE:  GEN-  NAD, sitting in bed NOSE-  Dried blood on right, merocel packing on left OC/OP- no posterior oropharyngeal bleeding.  Some erythema of posterior oropharynx  IMPRESSION:  epistaxsis and leukocytosis  PLAN:  Leukocytosis preceded epistaxis in ER.  Recommend continued evaluation.  In regards to packing.  Nasal saline spray TID.  Vaseline TID to nose with qtip.  Follow up Monday at [facility] at  [place] for packing removal and possible cautery.  Will need gram+ coverage with antibiotics until packing removed.  Stacy Strong 12/03/2020, 8:36 AM

## 2020-12-03 NOTE — Plan of Care (Signed)
  Problem: Education: Goal: Knowledge of General Education information will improve Description: Including pain rating scale, medication(s)/side effects and non-pharmacologic comfort measures Outcome: Progressing   Problem: Health Behavior/Discharge Planning: Goal: Ability to manage health-related needs will improve Outcome: Progressing   Problem: Clinical Measurements: Goal: Ability to maintain clinical measurements within normal limits will improve Outcome: Adequate for Discharge Goal: Will remain free from infection Outcome: Progressing Goal: Diagnostic test results will improve Outcome: Adequate for Discharge Goal: Respiratory complications will improve Outcome: Adequate for Discharge Goal: Cardiovascular complication will be avoided Outcome: Adequate for Discharge   Problem: Activity: Goal: Risk for activity intolerance will decrease Outcome: Adequate for Discharge   Problem: Nutrition: Goal: Adequate nutrition will be maintained Outcome: Adequate for Discharge   Problem: Coping: Goal: Level of anxiety will decrease Outcome: Adequate for Discharge   Problem: Elimination: Goal: Will not experience complications related to bowel motility Outcome: Adequate for Discharge Goal: Will not experience complications related to urinary retention Outcome: Adequate for Discharge   Problem: Pain Managment: Goal: General experience of comfort will improve Outcome: Progressing   Problem: Safety: Goal: Ability to remain free from injury will improve Outcome: Progressing

## 2020-12-03 NOTE — Discharge Summary (Addendum)
Physician Discharge Summary  Stacy Strong INO:[id number] DOB: 1956/05/25 DOA: 11/30/2020  PCP: Gayland Curry, MD  Admit date: 11/30/2020 Discharge date: 12/03/2020  Discharge disposition: Home   Recommendations for Outpatient Follow-Up:   Follow up with Dr. Pryor Ochoa, otolaryngologist on 12/06/2020 for nasal packing to be removed. Follow-up with PCP in 1 week     Discharge Diagnosis:   Active Problems:   Bleeding from the nose   Essential hypertension   Epistaxis    Discharge Condition: Stable.  Diet recommendation:  Diet Order            DIET DYS 3           DIET DYS 3 Room service appropriate? Yes; Fluid consistency: Thin  Diet effective now                   Code Status: Full Code     Hospital Course:   Stacy Strong is a 65 year old woman with medical history significant for hypertension, OSA on CPAP at home, who presented to the hospital with nosebleed.  Epistaxis could not be controlled so bilateral posterior double-balloon packing was done by the ED physician in the emergency room.  Following admission, patient complained of severe nasal/facial pain, dysphagia and odynophagia.  She was treated with Augmentin, analgesics and IV fluids because of poor oral intake.  Otolaryngologist was consulted to assist with management.  Initial bilateral nasal packing was removed and the left nose was repacked.  Facial/nasal pain has improved and epistaxis has resolved.  Dysphagia and odynophagia have improved as well.  She was evaluated by PT and OT.  Home PT was recommended.  Her condition has improved and she is deemed stable for discharge to home today.  Discharge plan was discussed with the patient, her husband and her daughter, [name] at the bedside.     Medical Consultants:    Otolaryngologist, Dr. Pryor Ochoa   Discharge Exam:    Vitals:   12/02/20 1953 12/02/20 2350 12/03/20 0409 12/03/20 0757  BP: (!) 113/51 (!) 115/46 (!) 123/51 (!) 124/59  Pulse: 72 63  63 64  Resp: 18 19 18 18   Temp: 98 F (36.7 C) (!) 97.4 F (36.3 C) 98.2 F (36.8 C) 97.9 F (36.6 C)  TempSrc:  Oral Oral Oral  SpO2: 96% 96% 97% 99%  Weight:      Height:         GEN: NAD SKIN: Warm and dry EYES: EOMI ENT: MMM.  Left nasal packing in place.  No epistaxis. CV: RRR PULM: CTA B ABD: soft, ND, NT, +BS CNS: AAO x 3, non focal EXT: No edema or tenderness   The results of significant diagnostics from this hospitalization (including imaging, microbiology, ancillary and laboratory) are listed below for reference.     Procedures and Diagnostic Studies:   No results found.   Labs:   Basic Metabolic Panel: Recent Labs  Lab 11/30/20 1520 12/01/20 0520  NA 137 139  K 3.9 3.5  CL 103 102  CO2 26 28  GLUCOSE 144* 152*  BUN 18 26*  CREATININE 0.87 0.93  CALCIUM 9.0 9.0   GFR Estimated Creatinine Clearance: 67.1 mL/min (by C-G formula based on SCr of 0.93 mg/dL). Liver Function Tests: No results for input(s): AST, ALT, ALKPHOS, BILITOT, PROT, ALBUMIN in the last 168 hours. No results for input(s): LIPASE, AMYLASE in the last 168 hours. No results for input(s): AMMONIA in the last 168 hours. Coagulation profile Recent Labs  Lab  11/30/20 1520  INR 1.1    CBC: Recent Labs  Lab 11/30/20 1520 12/01/20 0520 12/03/20 0451  WBC 11.3* 12.7* 11.1*  NEUTROABS 8.4*  --  7.3  HGB 14.3 14.2 12.2  HCT 42.5 44.3 36.4  MCV 86.4 91.7 88.6  PLT 273 195 211   Cardiac Enzymes: No results for input(s): CKTOTAL, CKMB, CKMBINDEX, TROPONINI in the last 168 hours. BNP: Invalid input(s): POCBNP CBG: No results for input(s): GLUCAP in the last 168 hours. D-Dimer No results for input(s): DDIMER in the last 72 hours. Hgb A1c No results for input(s): HGBA1C in the last 72 hours. Lipid Profile No results for input(s): CHOL, HDL, LDLCALC, TRIG, CHOLHDL, LDLDIRECT in the last 72 hours. Thyroid function studies No results for input(s): TSH, T4TOTAL, T3FREE,  THYROIDAB in the last 72 hours.  Invalid input(s): FREET3 Anemia work up No results for input(s): VITAMINB12, FOLATE, FERRITIN, TIBC, IRON, RETICCTPCT in the last 72 hours. Microbiology Recent Results (from the past 240 hour(s))  SARS CORONAVIRUS 2 (TAT 6-24 HRS) Nasopharyngeal Nasopharyngeal Swab     Status: None   Collection Time: 11/30/20  6:34 PM   Specimen: Nasopharyngeal Swab  Result Value Ref Range Status   SARS Coronavirus 2 NEGATIVE NEGATIVE Final    Comment: (NOTE) SARS-CoV-2 target nucleic acids are NOT DETECTED.  The SARS-CoV-2 RNA is generally detectable in upper and lower respiratory specimens during the acute phase of infection. Negative results do not preclude SARS-CoV-2 infection, do not rule out co-infections with other pathogens, and should not be used as the sole basis for treatment or other patient management decisions. Negative results must be combined with clinical observations, patient history, and epidemiological information. The expected result is Negative.  Fact Sheet for Patients: SugarRoll.be  Fact Sheet for Healthcare Providers: https://www.woods-mathews.com/  This test is not yet approved or cleared by the [place] FDA and  has been authorized for detection and/or diagnosis of SARS-CoV-2 by FDA under an Emergency Use Authorization (EUA). This EUA will remain  in effect (meaning this test can be used) for the duration of the COVID-19 declaration under Se ction 564(b)(1) of the Act, 21 U.S.C. section 360bbb-3(b)(1), unless the authorization is terminated or revoked sooner.  Performed at [facility], [place] [address], [place], [place] [address]      Discharge Instructions:   Discharge Instructions    DIET DYS 3   Complete by: As directed    Fluid consistency: Thin   Discharge instructions   Complete by: As directed    Apply vaseline three times a day to nose with qtip until follow up with  ENT physician   Increase activity slowly   Complete by: As directed      Allergies as of 12/03/2020   No Known Allergies     Medication List    STOP taking these medications   docusate sodium 250 MG capsule Commonly known as: COLACE     TAKE these medications   albuterol 108 (90 Base) MCG/ACT inhaler Commonly known as: VENTOLIN HFA Inhale 2 puffs into the lungs every 4 (four) hours as needed for wheezing.   amoxicillin-clavulanate 875-125 MG tablet Commonly known as: AUGMENTIN Take 1 tablet by mouth 2 (two) times daily for 5 days.   conjugated estrogens vaginal cream Commonly known as: PREMARIN Place 1 Applicatorful vaginally 2 (two) times a week.   dextromethorphan-guaiFENesin 30-600 MG 12hr tablet Commonly known as: MUCINEX DM Take 1 tablet by mouth 2 (two) times daily.   fluconazole 150  MG tablet Commonly known as: DIFLUCAN Take 1 tablet (150 mg total) by mouth once for 1 dose.   fluticasone 50 MCG/ACT nasal spray Commonly known as: FLONASE Place 1 spray into both nostrils daily.   montelukast 10 MG tablet Commonly known as: SINGULAIR Take 10 mg by mouth at bedtime.   sodium chloride 0.65 % Soln nasal spray Commonly known as: OCEAN Place 2 sprays into both nostrils every 8 (eight) hours for 5 days.   triamterene-hydrochlorothiazide 37.5-25 MG capsule Commonly known as: DYAZIDE Take 1 capsule by mouth daily.            Durable Medical Equipment  (From admission, onward)         Start     Ordered   12/03/20 1104  DME Walker  Once       Question Answer Comment  Walker: With 5 Inch Wheels   Patient needs a walker to treat with the following condition Debility      12/03/20 1104          Follow-up Information    Carloyn Manner, MD. Go on 12/06/2020.   Specialty: Otolaryngology Why: at 10:00 am to remove nasal packing Contact information: [place] [id number] [phone]                Time  coordinating discharge: 32 minutes  Signed:  [facility] 12/03/2020, 11:09 AM   Pager on [email or web address]. If 7PM-7AM, please contact night-coverage at [email or web address]

## 2021-03-11 ENCOUNTER — Other Ambulatory Visit: Payer: Self-pay | Admitting: Family Medicine

## 2021-03-11 DIAGNOSIS — Z1231 Encounter for screening mammogram for malignant neoplasm of breast: Secondary | ICD-10-CM

## 2021-04-19 ENCOUNTER — Ambulatory Visit
Admission: RE | Admit: 2021-04-19 | Discharge: 2021-04-19 | Disposition: A | Discharge status: Home / Self Care | Payer: BC Managed Care – PPO | Source: Ambulatory Visit | Attending: Family Medicine | Admitting: Family Medicine

## 2021-04-19 ENCOUNTER — Other Ambulatory Visit: Payer: Self-pay

## 2021-04-19 DIAGNOSIS — Z1231 Encounter for screening mammogram for malignant neoplasm of breast: Secondary | ICD-10-CM | POA: Diagnosis not present

## 2021-07-06 ENCOUNTER — Other Ambulatory Visit: Payer: Self-pay | Admitting: Otolaryngology

## 2021-07-06 DIAGNOSIS — E041 Nontoxic single thyroid nodule: Secondary | ICD-10-CM

## 2021-07-20 ENCOUNTER — Other Ambulatory Visit: Payer: Self-pay

## 2021-07-20 ENCOUNTER — Ambulatory Visit
Admission: RE | Admit: 2021-07-20 | Discharge: 2021-07-20 | Disposition: A | Discharge status: Home / Self Care | Payer: BC Managed Care – PPO | Source: Ambulatory Visit | Attending: Otolaryngology | Admitting: Otolaryngology

## 2021-07-20 DIAGNOSIS — E041 Nontoxic single thyroid nodule: Secondary | ICD-10-CM | POA: Insufficient documentation

## 2022-02-08 ENCOUNTER — Other Ambulatory Visit: Payer: Self-pay | Admitting: Family Medicine

## 2022-02-08 DIAGNOSIS — Z1231 Encounter for screening mammogram for malignant neoplasm of breast: Secondary | ICD-10-CM

## 2022-03-21 IMAGING — MG MM DIGITAL SCREENING BILAT W/ TOMO AND CAD
8 series · 8 of 24 positions shown · non-contrast
Comparison: Previous exam(s).

ACR Breast Density Category a: The breast tissue is almost entirely
fatty.

CLINICAL DATA: Screening.

EXAM:
DIGITAL SCREENING BILATERAL MAMMOGRAM WITH TOMOSYNTHESIS AND CAD
TECHNIQUE: Bilateral screening digital craniocaudal and mediolateral oblique
mammograms were obtained. Bilateral screening digital breast
tomosynthesis was performed. The images were evaluated with
computer-aided detection.

[L MLO synth-2D]
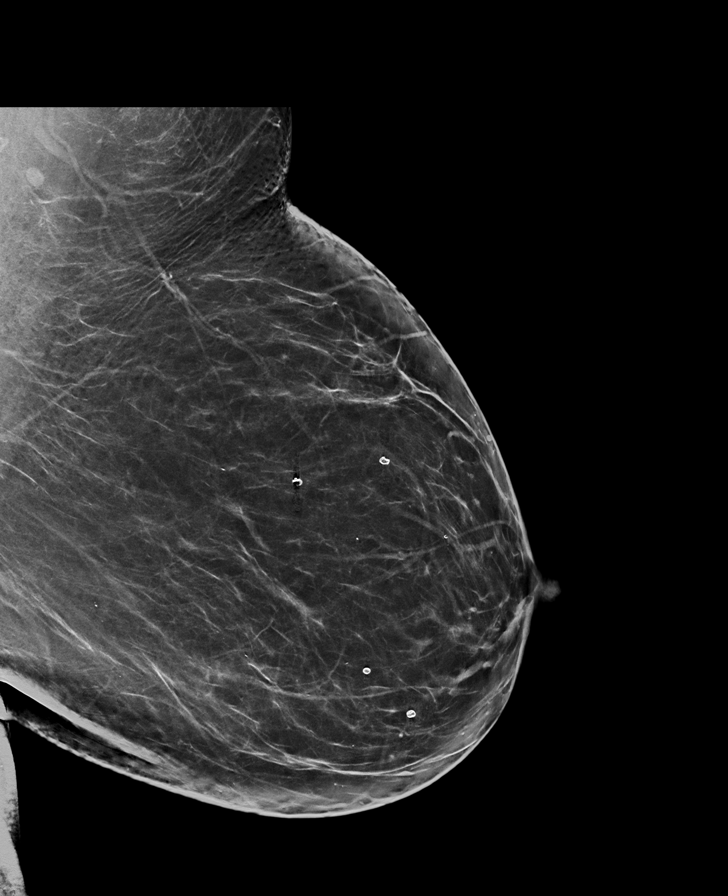

[R CC synth-2D]
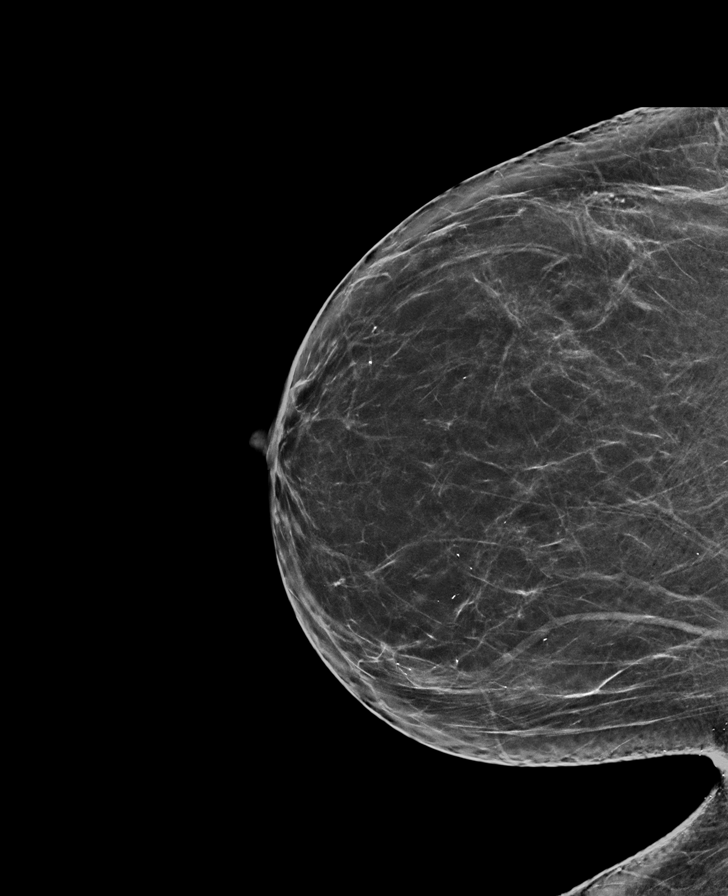

[L CC synth-2D]
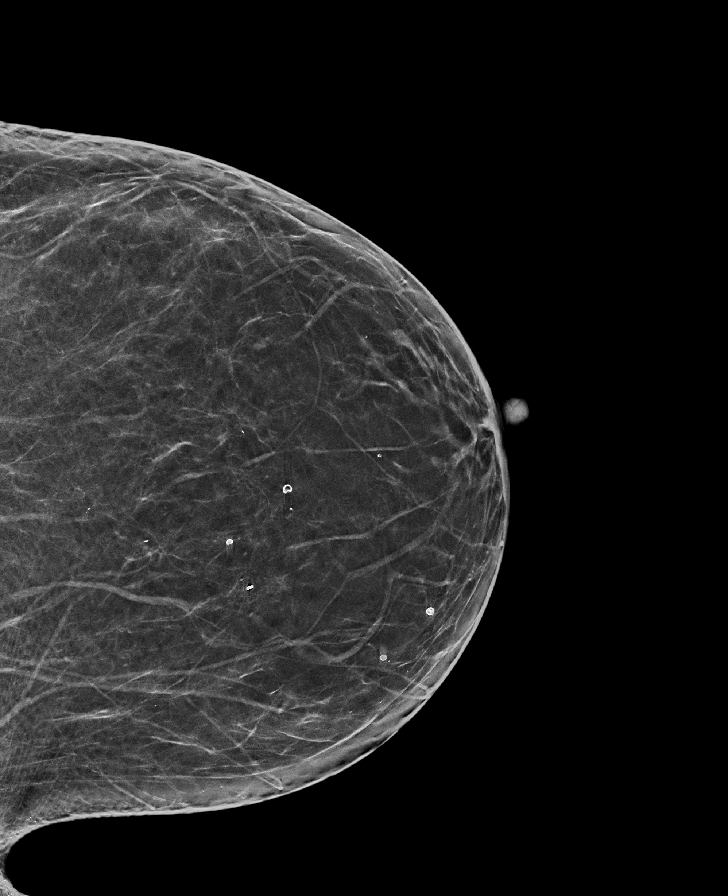

[R MLO synth-2D]
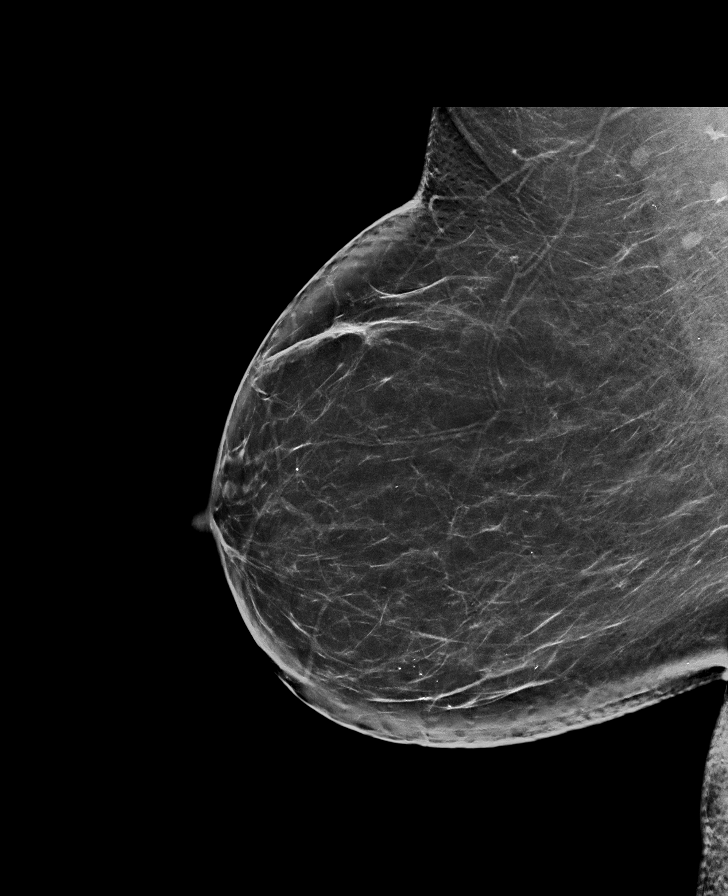

[R CC tomo · tomo slice 35/69.0]
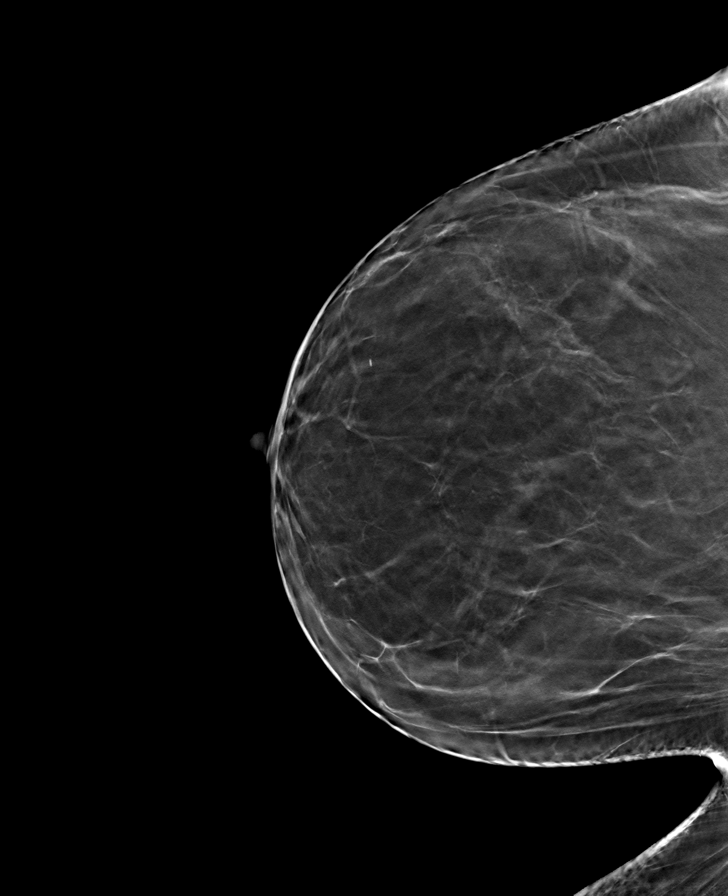

[L MLO tomo · tomo slice 42/83.0]
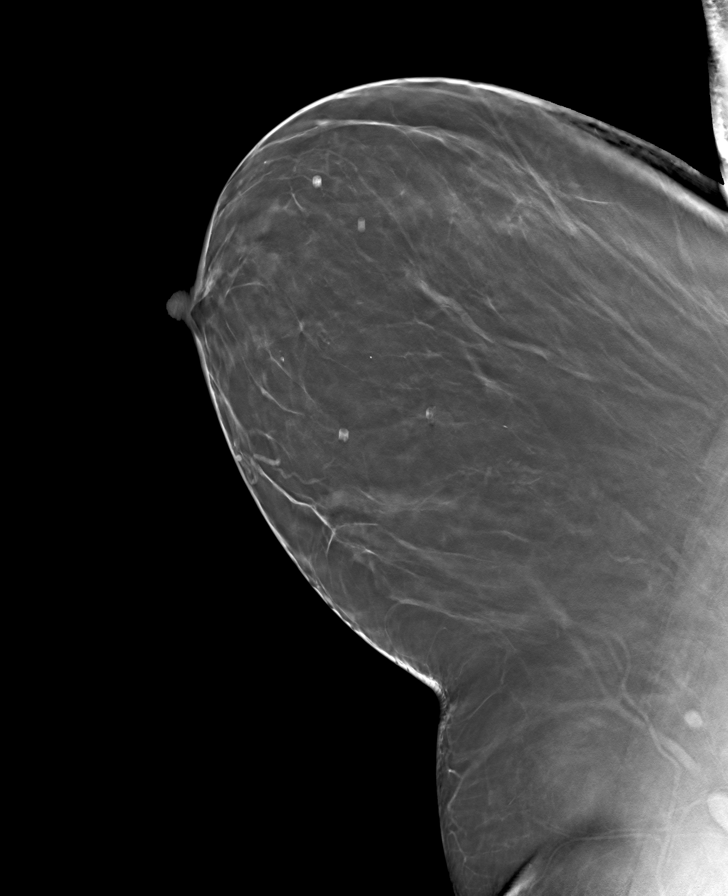

[R MLO tomo · tomo slice 41/82.0]
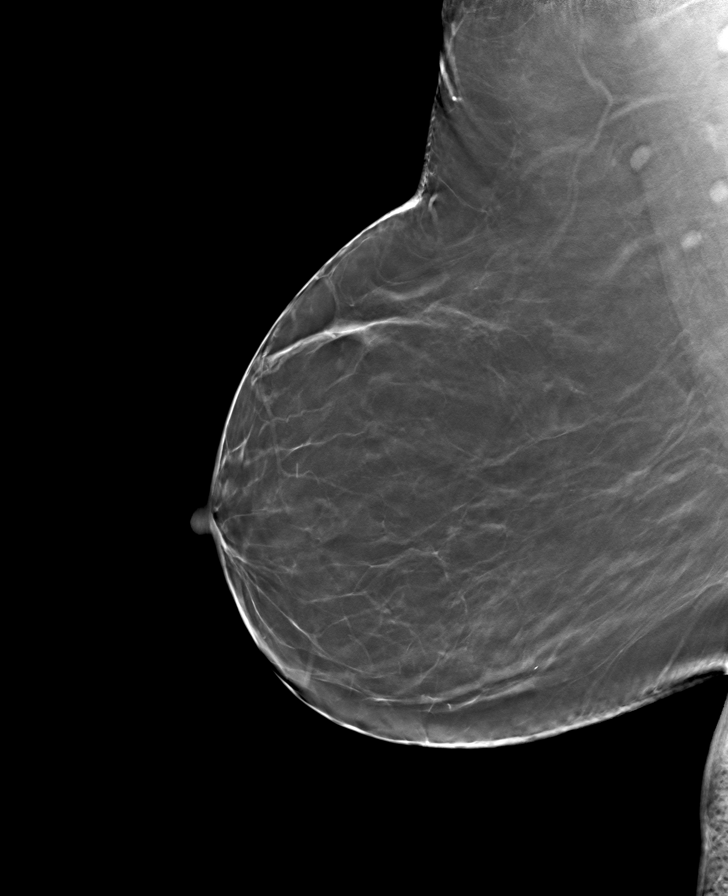

[L CC tomo · tomo slice 33/66.0]
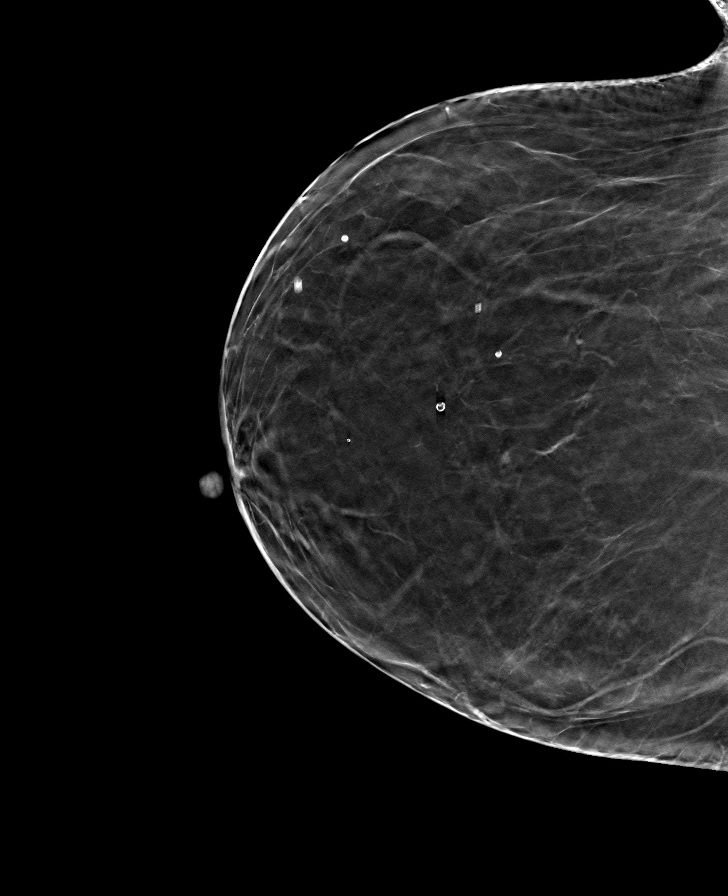

[8 of 24 positions shown; findings below may reference images not displayed]

FINDINGS: There are no findings suspicious for malignancy.
IMPRESSION: No mammographic evidence of malignancy. A result letter of this
screening mammogram will be mailed directly to the patient.

RECOMMENDATION:
Screening mammogram in one year. (Code:0E-3-N98)

BI-RADS CATEGORY  1: Negative.

## 2022-04-21 ENCOUNTER — Ambulatory Visit
Admission: RE | Admit: 2022-04-21 | Discharge: 2022-04-21 | Disposition: A | Discharge status: Home / Self Care | Payer: BC Managed Care – PPO | Source: Ambulatory Visit | Attending: Family Medicine | Admitting: Family Medicine

## 2022-04-21 DIAGNOSIS — Z1231 Encounter for screening mammogram for malignant neoplasm of breast: Secondary | ICD-10-CM | POA: Diagnosis present

## 2023-03-08 ENCOUNTER — Encounter: Payer: Self-pay | Admitting: Gastroenterology

## 2023-03-14 ENCOUNTER — Encounter: Payer: Self-pay | Admitting: Gastroenterology

## 2023-03-15 ENCOUNTER — Other Ambulatory Visit: Payer: Self-pay

## 2023-03-15 ENCOUNTER — Ambulatory Visit: Payer: Medicare Other | Admitting: Anesthesiology

## 2023-03-15 ENCOUNTER — Encounter: Payer: Self-pay | Admitting: Gastroenterology

## 2023-03-15 ENCOUNTER — Ambulatory Visit
Admission: RE | Admit: 2023-03-15 | Discharge: 2023-03-15 | Disposition: A | Discharge status: Home / Self Care | Payer: Medicare Other | Attending: Gastroenterology | Admitting: Gastroenterology

## 2023-03-15 ENCOUNTER — Encounter
Admission: RE | Disposition: A | Discharge status: Home / Self Care | Payer: Self-pay | Source: Home / Self Care | Attending: Gastroenterology

## 2023-03-15 DIAGNOSIS — Z86010 Personal history of colonic polyps: Secondary | ICD-10-CM | POA: Insufficient documentation

## 2023-03-15 DIAGNOSIS — I10 Essential (primary) hypertension: Secondary | ICD-10-CM | POA: Diagnosis not present

## 2023-03-15 DIAGNOSIS — K6289 Other specified diseases of anus and rectum: Secondary | ICD-10-CM | POA: Diagnosis not present

## 2023-03-15 DIAGNOSIS — K5730 Diverticulosis of large intestine without perforation or abscess without bleeding: Secondary | ICD-10-CM | POA: Diagnosis not present

## 2023-03-15 DIAGNOSIS — Z6839 Body mass index (BMI) 39.0-39.9, adult: Secondary | ICD-10-CM | POA: Diagnosis not present

## 2023-03-15 DIAGNOSIS — D123 Benign neoplasm of transverse colon: Secondary | ICD-10-CM | POA: Insufficient documentation

## 2023-03-15 DIAGNOSIS — Z1211 Encounter for screening for malignant neoplasm of colon: Secondary | ICD-10-CM | POA: Diagnosis not present

## 2023-03-15 DIAGNOSIS — K5909 Other constipation: Secondary | ICD-10-CM | POA: Diagnosis not present

## 2023-03-15 HISTORY — PX: COLONOSCOPY WITH PROPOFOL: SHX5780

## 2023-03-15 SURGERY — COLONOSCOPY WITH PROPOFOL
Anesthesia: General

## 2023-03-15 MED ORDER — SODIUM CHLORIDE 0.9 % IV SOLN: INTRAVENOUS | Status: DC

## 2023-03-15 MED ORDER — PROPOFOL 10 MG/ML IV BOLUS
INTRAVENOUS | Status: DC | PRN
  Administered 2023-03-15: 60 mg via INTRAVENOUS
  Administered 2023-03-15: 20 mg via INTRAVENOUS

## 2023-03-15 MED ORDER — PROPOFOL 500 MG/50ML IV EMUL
INTRAVENOUS | Status: DC | PRN
  Administered 2023-03-15: 165 ug/kg/min via INTRAVENOUS

## 2023-03-15 MED ORDER — LIDOCAINE HCL (CARDIAC) PF 100 MG/5ML IV SOSY
PREFILLED_SYRINGE | INTRAVENOUS | Status: DC | PRN
  Administered 2023-03-15: 100 mg via INTRAVENOUS

## 2023-03-15 NOTE — Interval H&P Note (Signed)
History and Physical Interval Note: Preprocedure H&P from 03/15/23  was reviewed and there was no interval change after seeing and examining the patient.  Written consent was obtained from the patient after discussion of risks, benefits, and alternatives. Patient has consented to proceed with Colonoscopy with possible intervention   03/15/2023 12:45 PM  [name]  has presented today for surgery, with the diagnosis of V12.72 (ICD-9-CM) - Z86.010 (ICD-10-CM) - Personal history of colonic polyps.  The various methods of treatment have been discussed with the patient and family. After consideration of risks, benefits and other options for treatment, the patient has consented to  Procedure(s): COLONOSCOPY WITH PROPOFOL (N/A) as a surgical intervention.  The patient's history has been reviewed, patient examined, no change in status, stable for surgery.  I have reviewed the patient's chart and labs.  Questions were answered to the patient's satisfaction.     Stacy Strong

## 2023-03-15 NOTE — Transfer of Care (Signed)
00Immediate Anesthesia Transfer of Care Note  Patient: Stacy Strong  Procedure(s) Performed: COLONOSCOPY WITH PROPOFOL  Patient Location: Endoscopy Unit  Anesthesia Type:General  Level of Consciousness: drowsy and patient cooperative  Airway & Oxygen Therapy: Patient Spontanous Breathing and Patient connected to face mask oxygen  Post-op Assessment: Report given to RN and Post -op Vital signs reviewed and stable  Post vital signs: Reviewed and stable  Last Vitals:  Vitals Value Taken Time  BP    Temp    Pulse    Resp    SpO2      Last Pain:  Vitals:   03/15/23 1209  TempSrc: Temporal  PainSc: 0-No pain         Complications: No notable events documented.

## 2023-03-15 NOTE — Anesthesia Procedure Notes (Signed)
Procedure Name: General with mask airway Date/Time: 03/15/2023 12:53 PM  Performed by: Mohammed Kindle, CRNAPre-anesthesia Checklist: Patient identified, Emergency Drugs available, Suction available and Patient being monitored Patient Re-evaluated:Patient Re-evaluated prior to induction Oxygen Delivery Method: Simple face mask Induction Type: IV induction Placement Confirmation: positive ETCO2, CO2 detector and breath sounds checked- equal and bilateral Dental Injury: Teeth and Oropharynx as per pre-operative assessment

## 2023-03-15 NOTE — H&P (Signed)
Pre-Procedure H&P   Patient ID: Stacy Strong is a 67 y.o. female.  Gastroenterology Provider: Jaynie Collins, DO  Referring Provider: Tawni Pummel, PA PCP: Leim Fabry, MD  Date: 03/15/2023  HPI Ms. Stacy Strong is a 67 y.o. female who presents today for Colonoscopy for Surveillance-personal history of colon polyps .  Patient deals with chronic constipation.  As time elapsed between bowel movements she has increasing back pain.  This is then relieved by defecation.  She denies any melena or hematochezia.  No family history of colorectal cancer or colon polyps.  Last underwent colonoscopy in January 2018 with 1 adenomatous polyp and sigmoid diverticulosis noted.  Recent lab work creatinine 1.0 hemoglobin 13.4 MCV 90 platelets 246,000   Past Medical History:  Diagnosis Date   Allergic state    Hypertension     Past Surgical History:  Procedure Laterality Date   ABDOMINAL HYSTERECTOMY     BREAST BIOPSY Left 01/12/2014    FOCAL MICROCALCIFICATIONS IN FIBROCYSTIC CHANGES.    BREAST SURGERY Left    breast bx.with chip placement   COLONOSCOPY N/A 10/24/2016   Procedure: COLONOSCOPY;  Surgeon: Christena Deem, MD;  Location: [facility] ENDOSCOPY;  Service: Endoscopy;  Laterality: N/A;    Family History No h/o GI disease or malignancy  Review of Systems  Constitutional:  Negative for activity change, appetite change, chills, diaphoresis, fatigue, fever and unexpected weight change.  HENT:  Negative for trouble swallowing and voice change.   Respiratory:  Negative for shortness of breath and wheezing.   Cardiovascular:  Negative for chest pain, palpitations and leg swelling.  Gastrointestinal:  Negative for abdominal distention, abdominal pain, anal bleeding, blood in stool, constipation, diarrhea, nausea, rectal pain and vomiting.  Musculoskeletal:  Negative for arthralgias and myalgias.  Skin:  Negative for color change and pallor.  Neurological:  Negative for  dizziness, syncope and weakness.  Psychiatric/Behavioral:  Negative for confusion.   All other systems reviewed and are negative.    Medications No current facility-administered medications on file prior to encounter.   Current Outpatient Medications on File Prior to Encounter  Medication Sig Dispense Refill   atorvastatin (LIPITOR) 20 MG tablet Take 20 mg by mouth daily.     DULoxetine (CYMBALTA) 20 MG capsule Take 20 mg by mouth daily.     montelukast (SINGULAIR) 10 MG tablet Take 10 mg by mouth at bedtime.     triamterene-hydrochlorothiazide (DYAZIDE) 37.5-25 MG capsule Take 1 capsule by mouth daily.     albuterol (VENTOLIN HFA) 108 (90 Base) MCG/ACT inhaler Inhale 2 puffs into the lungs every 4 (four) hours as needed for wheezing.     conjugated estrogens (PREMARIN) vaginal cream Place 1 Applicatorful vaginally 2 (two) times a week. (Patient not taking: Reported on 03/15/2023)     dextromethorphan-guaiFENesin (MUCINEX DM) 30-600 MG 12hr tablet Take 1 tablet by mouth 2 (two) times daily.     fluticasone (FLONASE) 50 MCG/ACT nasal spray Place 1 spray into both nostrils daily.     sodium chloride (OCEAN) 0.65 % SOLN nasal spray Place 2 sprays into both nostrils every 8 (eight) hours for 5 days.  0    Pertinent medications related to GI and procedure were reviewed by me with the patient prior to the procedure   Current Facility-Administered Medications:    0.9 %  sodium chloride infusion, , Intravenous, Continuous, Jaynie Collins, DO, Last Rate: 20 mL/hr at 03/15/23 1232, New Bag at 03/15/23 1232  sodium chloride 20 mL/hr  at 03/15/23 1232       No Known Allergies Allergies were reviewed by me prior to the procedure  Objective   Body mass index is 39.73 kg/m. Vitals:   03/15/23 1209  BP: (!) 156/76  Pulse: (!) 53  Resp: 16  Temp: 97.8 F (36.6 C)  TempSrc: Temporal  SpO2: 99%  Weight: 98.5 kg  Height: 5\' 2"  (1.575 m)     Physical Exam Vitals and nursing note  reviewed.  Constitutional:      General: She is not in acute distress.    Appearance: Normal appearance. She is obese. She is not ill-appearing, toxic-appearing or diaphoretic.  HENT:     Head: Normocephalic and atraumatic.     Nose: Nose normal.     Mouth/Throat:     Mouth: Mucous membranes are moist.     Pharynx: Oropharynx is clear.  Eyes:     General: No scleral icterus.    Extraocular Movements: Extraocular movements intact.  Cardiovascular:     Rate and Rhythm: Regular rhythm. Bradycardia present.     Heart sounds: Normal heart sounds. No murmur heard.    No friction rub. No gallop.  Pulmonary:     Effort: Pulmonary effort is normal. No respiratory distress.     Breath sounds: Normal breath sounds. No wheezing, rhonchi or rales.  Abdominal:     General: Bowel sounds are normal. There is no distension.     Palpations: Abdomen is soft.     Tenderness: There is no abdominal tenderness. There is no guarding or rebound.  Musculoskeletal:     Cervical back: Neck supple.     Right lower leg: No edema.     Left lower leg: No edema.  Skin:    General: Skin is warm and dry.     Coloration: Skin is not jaundiced or pale.  Neurological:     General: No focal deficit present.     Mental Status: She is alert and oriented to person, place, and time. Mental status is at baseline.  Psychiatric:        Mood and Affect: Mood normal.        Behavior: Behavior normal.        Thought Content: Thought content normal.        Judgment: Judgment normal.      Assessment:  Stacy Strong is a 67 y.o. female  who presents today for Colonoscopy for Surveillance-personal history of colon polyps .  Plan:  Colonoscopy with possible intervention today  Colonoscopy with possible biopsy, control of bleeding, polypectomy, and interventions as necessary has been discussed with the patient/patient representative. Informed consent was obtained from the patient/patient representative after  explaining the indication, nature, and risks of the procedure including but not limited to death, bleeding, perforation, missed neoplasm/lesions, cardiorespiratory compromise, and reaction to medications. Opportunity for questions was given and appropriate answers were provided. Patient/patient representative has verbalized understanding is amenable to undergoing the procedure.   Jaynie Collins, DO  [facility]  Portions of the record may have been created with voice recognition software. Occasional wrong-word or 'sound-a-like' substitutions may have occurred due to the inherent limitations of voice recognition software.  Read the chart carefully and recognize, using context, where substitutions may have occurred.

## 2023-03-15 NOTE — Anesthesia Preprocedure Evaluation (Signed)
Anesthesia Evaluation  Patient identified by MRN, date of birth, ID band Patient awake    Airway Mallampati: II  TM Distance: >3 FB Neck ROM: Full    Dental  (+) Teeth Intact, Missing   Pulmonary neg pulmonary ROS   breath sounds clear to auscultation       Cardiovascular Exercise Tolerance: Good hypertension, Pt. on medications  Rhythm:Regular Rate:Normal     Neuro/Psych negative neurological ROS  negative psych ROS   GI/Hepatic negative GI ROS, Neg liver ROS,,,  Endo/Other    Morbid obesity  Renal/GU negative Renal ROS     Musculoskeletal   Abdominal  (+) + obese  Peds negative pediatric ROS (+)  Hematology negative hematology ROS (+)   Anesthesia Other Findings   Reproductive/Obstetrics                             Anesthesia Physical Anesthesia Plan  ASA: 2  Anesthesia Plan: General   Post-op Pain Management:    Induction: Intravenous  PONV Risk Score and Plan:   Airway Management Planned: Natural Airway  Additional Equipment:   Intra-op Plan:   Post-operative Plan:   Informed Consent: I have reviewed the patients History and Physical, chart, labs and discussed the procedure including the risks, benefits and alternatives for the proposed anesthesia with the patient or authorized representative who has indicated his/her understanding and acceptance.       Plan Discussed with: CRNA and Surgeon  Anesthesia Plan Comments:        Anesthesia Quick Evaluation

## 2023-03-15 NOTE — Op Note (Signed)
[facility] Patient Name: Stacy Strong Procedure Date: 03/15/2023 12:39 PM MRN: [id number] Account #: [id number] Date of Birth: 07-01-1956 Admit Type: Outpatient Age: 67 Room: [facility] ENDO ROOM 1 Gender: Female Note Status: Finalized Instrument Name: Colonscope [id number] Procedure:             Colonoscopy Indications:           High risk colon cancer surveillance: Personal history                         of colonic polyps Providers:             Trenda Moots, DO Referring MD:          Leim Fabry MD, MD (Referring MD) Medicines:             Monitored Anesthesia Care Complications:         No immediate complications. Estimated blood loss:                         Minimal. Procedure:             Pre-Anesthesia Assessment:                        - Prior to the procedure, a History and Physical was                         performed, and patient medications and allergies were                         reviewed. The patient is competent. The risks and                         benefits of the procedure and the sedation options and                         risks were discussed with the patient. All questions                         were answered and informed consent was obtained.                         Patient identification and proposed procedure were                         verified by the physician, the nurse, the anesthetist                         and the technician in the endoscopy suite. Mental                         Status Examination: alert and oriented. Airway                         Examination: normal oropharyngeal airway and neck                         mobility. Respiratory Examination: clear to  auscultation. CV Examination: RRR, no murmurs, no S3                         or S4. Prophylactic Antibiotics: The patient does not                         require prophylactic antibiotics. Prior                          Anticoagulants: The patient has taken no anticoagulant                         or antiplatelet agents. ASA Grade Assessment: II - A                         patient with mild systemic disease. After reviewing                         the risks and benefits, the patient was deemed in                         satisfactory condition to undergo the procedure. The                         anesthesia plan was to use monitored anesthesia care                         (MAC). Immediately prior to administration of                         medications, the patient was re-assessed for adequacy                         to receive sedatives. The heart rate, respiratory                         rate, oxygen saturations, blood pressure, adequacy of                         pulmonary ventilation, and response to care were                         monitored throughout the procedure. The physical                         status of the patient was re-assessed after the                         procedure.                        After obtaining informed consent, the colonoscope was                         passed under direct vision. Throughout the procedure,                         the patient's blood pressure, pulse, and oxygen  saturations were monitored continuously. The                         Colonoscope was introduced through the anus and                         advanced to the the terminal ileum, with                         identification of the appendiceal orifice and IC                         valve. The colonoscopy was performed without                         difficulty. The patient tolerated the procedure well.                         The quality of the bowel preparation was evaluated                         using the BBPS [facility] Bowel Preparation Scale) with                         scores of: Right Colon = 2 (minor amount of residual                         staining, small fragments  of stool and/or opaque                         liquid, but mucosa seen well), Transverse Colon = 3                         (entire mucosa seen well with no residual staining,                         small fragments of stool or opaque liquid) and Left                         Colon = 3 (entire mucosa seen well with no residual                         staining, small fragments of stool or opaque liquid).                         The total BBPS score equals 8. The quality of the                         bowel preparation was excellent. The terminal ileum,                         ileocecal valve, appendiceal orifice, and rectum were                         photographed. Findings:      The perianal and digital rectal examinations were normal. Pertinent       negatives include normal sphincter tone.  The terminal ileum appeared normal. Estimated blood loss: none.      Retroflexion in the right colon was performed.      Scattered small-mouthed diverticula were found in the entire colon.       Estimated blood loss: none.      Anal papilla(e) were hypertrophied. Estimated blood loss: none.      A 4 to 5 mm polyp was found in the transverse colon. The polyp was       sessile. The polyp was removed with a cold snare. Resection and       retrieval were complete. Estimated blood loss was minimal.      A 2 to 3 mm polyp was found in the transverse colon. The polyp was       sessile. The polyp was removed with a cold snare. Resection was       complete, but the polyp tissue was not retrieved. Estimated blood loss       was minimal.      The exam was otherwise without abnormality on direct and retroflexion       views. Impression:            - The examined portion of the ileum was normal.                        - Diverticulosis in the entire examined colon.                        - Anal papilla(e) were hypertrophied.                        - One 4 to 5 mm polyp in the transverse colon, removed                          with a cold snare. Resected and retrieved.                        - One 2 to 3 mm polyp in the transverse colon, removed                         with a cold snare. Complete resection. Polyp tissue                         not retrieved.                        - The examination was otherwise normal on direct and                         retroflexion views. Recommendation:        - Patient has a contact number available for                         emergencies. The signs and symptoms of potential                         delayed complications were discussed with the patient.                         Return to normal activities tomorrow. Written  discharge instructions were provided to the patient.                        - Discharge patient to home.                        - Resume previous diet.                        - Continue present medications.                        - Await pathology results.                        - No ibuprofen, naproxen, or other non-steroidal                         anti-inflammatory drugs for 5 days after polyp removal.                        - Repeat colonoscopy for surveillance based on                         pathology results.                        - Return to GI office as previously scheduled.                        - The findings and recommendations were discussed with                         the patient. Procedure Code(s):     --- Professional ---                        [phone], Colonoscopy, flexible; with removal of                         tumor(s), polyp(s), or other lesion(s) by snare                         technique Diagnosis Code(s):     --- Professional ---                        Z86.010, Personal history of colonic polyps                        K62.89, Other specified diseases of anus and rectum                        D12.3, Benign neoplasm of transverse colon (hepatic                         flexure or splenic  flexure)                        K57.30, Diverticulosis of large intestine without                         perforation or abscess  without bleeding CPT copyright 2022 [employer]. All rights reserved. The codes documented in this report are preliminary and upon coder review may  be revised to meet current compliance requirements. Attending Participation:      I personally performed the entire procedure. Elfredia Nevins, DO Jaynie Collins DO, DO 03/15/2023 1:18:50 PM This report has been signed electronically. Number of Addenda: 0 Note Initiated On: 03/15/2023 12:39 PM Scope Withdrawal Time: 0 hours 12 minutes 59 seconds  Total Procedure Duration: 0 hours 17 minutes 13 seconds  Estimated Blood Loss:  Estimated blood loss was minimal.      [facility]

## 2023-03-15 NOTE — Anesthesia Postprocedure Evaluation (Signed)
Anesthesia Post Note  Patient: Stacy Strong  Procedure(s) Performed: COLONOSCOPY WITH PROPOFOL  Patient location during evaluation: PACU Anesthesia Type: General Level of consciousness: awake Pain management: pain level controlled Vital Signs Assessment: post-procedure vital signs reviewed and stable Respiratory status: spontaneous breathing Cardiovascular status: stable Anesthetic complications: no   No notable events documented.   Last Vitals:  Vitals:   03/15/23 1328 03/15/23 1338  BP: 123/68 132/62  Pulse: 62 (!) 52  Resp: 13 (!) 26  Temp:    SpO2: 97% 99%    Last Pain:  Vitals:   03/15/23 1338  TempSrc:   PainSc: 0-No pain                 VAN STAVEREN,Persephone Schriever

## 2023-03-16 ENCOUNTER — Encounter: Payer: Self-pay | Admitting: Gastroenterology

## 2023-03-27 ENCOUNTER — Other Ambulatory Visit: Payer: Self-pay | Admitting: Family Medicine

## 2023-03-27 DIAGNOSIS — Z1231 Encounter for screening mammogram for malignant neoplasm of breast: Secondary | ICD-10-CM

## 2023-04-10 ENCOUNTER — Other Ambulatory Visit: Payer: Self-pay | Admitting: Family Medicine

## 2023-04-10 DIAGNOSIS — Z780 Asymptomatic menopausal state: Secondary | ICD-10-CM

## 2023-06-01 ENCOUNTER — Ambulatory Visit
Admission: RE | Admit: 2023-06-01 | Discharge: 2023-06-01 | Disposition: A | Discharge status: Home / Self Care | Payer: Medicare PPO | Source: Ambulatory Visit | Attending: Family Medicine | Admitting: Family Medicine

## 2023-06-01 DIAGNOSIS — Z780 Asymptomatic menopausal state: Secondary | ICD-10-CM | POA: Diagnosis present

## 2023-06-01 DIAGNOSIS — Z1231 Encounter for screening mammogram for malignant neoplasm of breast: Secondary | ICD-10-CM | POA: Diagnosis not present

## 2024-05-07 ENCOUNTER — Other Ambulatory Visit: Payer: Self-pay | Admitting: Family Medicine

## 2024-05-07 DIAGNOSIS — Z1231 Encounter for screening mammogram for malignant neoplasm of breast: Secondary | ICD-10-CM

## 2024-06-04 ENCOUNTER — Ambulatory Visit
Admission: RE | Admit: 2024-06-04 | Discharge: 2024-06-04 | Disposition: A | Discharge status: Home / Self Care | Source: Ambulatory Visit | Attending: Family Medicine | Admitting: Family Medicine

## 2024-06-04 DIAGNOSIS — Z1231 Encounter for screening mammogram for malignant neoplasm of breast: Secondary | ICD-10-CM | POA: Diagnosis present
